# Patient Record
Sex: Male | Born: 1935
Health system: Southern US, Community
[De-identification: ages and names within clinical notes are randomized; demographics above are authoritative.]

## PROBLEM LIST (undated history)

## (undated) DIAGNOSIS — I10 Essential (primary) hypertension: Secondary | ICD-10-CM

## (undated) DIAGNOSIS — I350 Nonrheumatic aortic (valve) stenosis: Secondary | ICD-10-CM

## (undated) DIAGNOSIS — R413 Other amnesia: Secondary | ICD-10-CM

## (undated) DIAGNOSIS — Z86718 Personal history of other venous thrombosis and embolism: Secondary | ICD-10-CM

## (undated) DIAGNOSIS — E785 Hyperlipidemia, unspecified: Secondary | ICD-10-CM

## (undated) HISTORY — DX: Hyperlipidemia, unspecified: E78.5

## (undated) HISTORY — DX: Essential (primary) hypertension: I10

## (undated) HISTORY — DX: Nonrheumatic aortic (valve) stenosis: I35.0

## (undated) HISTORY — DX: Personal history of other venous thrombosis and embolism: Z86.718

## (undated) HISTORY — DX: Other amnesia: R41.3

## (undated) HISTORY — PX: OTHER SURGICAL HISTORY: SHX169

---

## 1998-06-11 ENCOUNTER — Emergency Department (HOSPITAL_COMMUNITY): Admission: EM | Admit: 1998-06-11 | Discharge: 1998-06-12 | Payer: Self-pay | Admitting: Emergency Medicine

## 1998-09-09 ENCOUNTER — Encounter: Admission: RE | Admit: 1998-09-09 | Discharge: 1998-09-09 | Payer: Self-pay | Admitting: Family Medicine

## 2001-12-09 ENCOUNTER — Encounter: Admission: RE | Admit: 2001-12-09 | Discharge: 2001-12-09 | Payer: Self-pay

## 2002-08-14 ENCOUNTER — Encounter: Admission: RE | Admit: 2002-08-14 | Discharge: 2002-08-14 | Payer: Self-pay | Admitting: Family Medicine

## 2002-08-14 ENCOUNTER — Encounter: Payer: Self-pay | Admitting: Family Medicine

## 2005-01-27 ENCOUNTER — Emergency Department (HOSPITAL_COMMUNITY): Admission: EM | Admit: 2005-01-27 | Discharge: 2005-01-27 | Payer: Self-pay | Admitting: Emergency Medicine

## 2008-01-01 DIAGNOSIS — Z86718 Personal history of other venous thrombosis and embolism: Secondary | ICD-10-CM

## 2008-01-01 HISTORY — DX: Personal history of other venous thrombosis and embolism: Z86.718

## 2008-06-10 ENCOUNTER — Inpatient Hospital Stay (HOSPITAL_COMMUNITY): Admission: EM | Admit: 2008-06-10 | Discharge: 2008-06-15 | Payer: Self-pay | Admitting: Emergency Medicine

## 2008-06-10 ENCOUNTER — Encounter: Admission: RE | Admit: 2008-06-10 | Discharge: 2008-06-10 | Payer: Self-pay | Admitting: Family Medicine

## 2008-07-05 HISTORY — PX: TRANSTHORACIC ECHOCARDIOGRAM: SHX275

## 2008-07-14 ENCOUNTER — Ambulatory Visit: Payer: Self-pay | Admitting: Oncology

## 2008-08-06 ENCOUNTER — Ambulatory Visit (HOSPITAL_COMMUNITY): Admission: RE | Admit: 2008-08-06 | Discharge: 2008-08-06 | Payer: Self-pay | Admitting: Oncology

## 2008-09-22 ENCOUNTER — Ambulatory Visit: Payer: Self-pay | Admitting: Oncology

## 2008-09-24 ENCOUNTER — Ambulatory Visit (HOSPITAL_COMMUNITY): Admission: RE | Admit: 2008-09-24 | Discharge: 2008-09-24 | Payer: Self-pay | Admitting: Oncology

## 2008-12-16 ENCOUNTER — Ambulatory Visit: Payer: Self-pay | Admitting: Oncology

## 2008-12-21 ENCOUNTER — Encounter: Admission: RE | Admit: 2008-12-21 | Discharge: 2008-12-21 | Payer: Self-pay | Admitting: Oncology

## 2009-03-23 ENCOUNTER — Ambulatory Visit: Payer: Self-pay | Admitting: Oncology

## 2009-08-02 IMAGING — US US EXTREM LOW VENOUS BILAT
1 series · 14 of 24 positions shown · non-contrast
Comparison: None

CLINICAL DATA: Bilateral lower extremity swelling

BILATERAL LOWER EXTREMITY VENOUS DUPLEX ULTRASOUND
TECHNIQUE: Gray-scale sonography with graded compression, as well
as color Doppler and duplex ultrasound, were performed to evaluate
the deep venous system of both lower extremities from the level of
the common femoral vein through the popliteal and proximal calf
veins. Spectral Doppler was utilized to evaluate flow at rest and
with distal augmentation maneuvers.

[Series 1: us extrem low venous bilat · 14 of 58 slices shown]
[im 1/58]
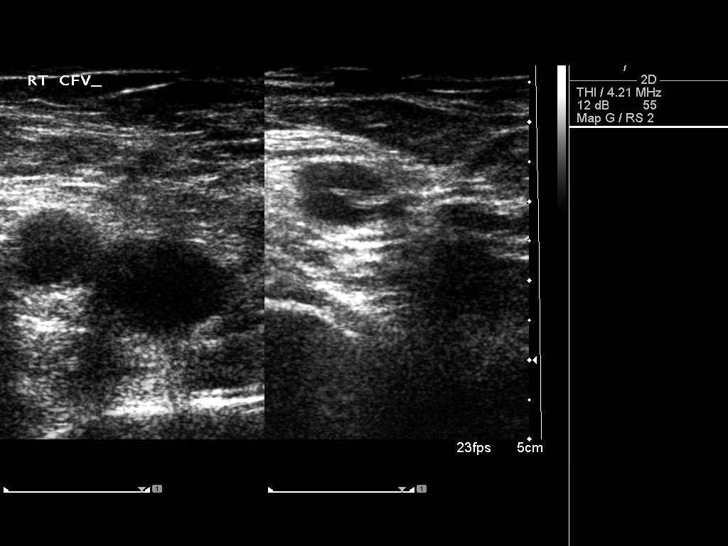
[im 5/58]
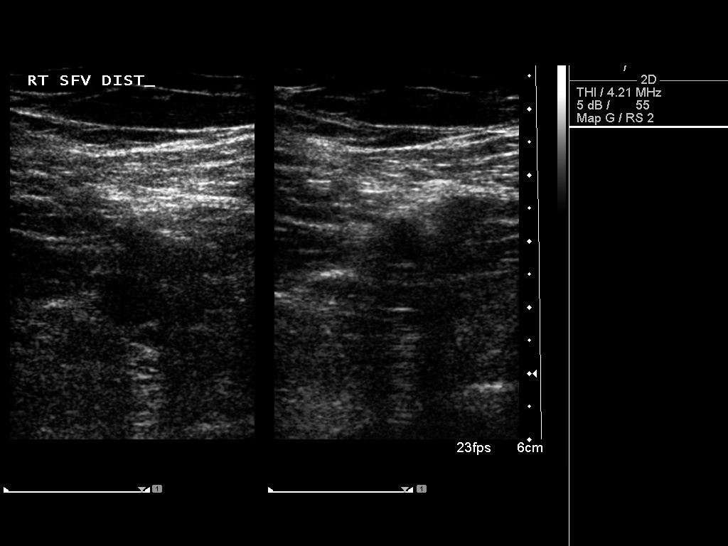
[im 10/58]
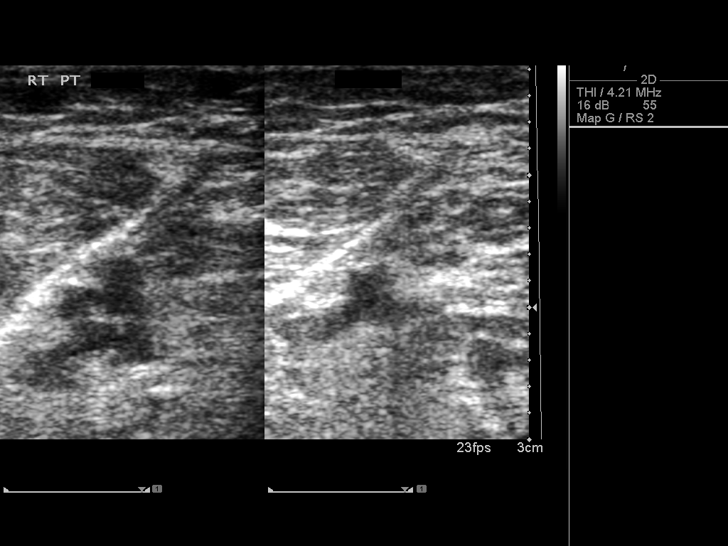
[im 15/58]
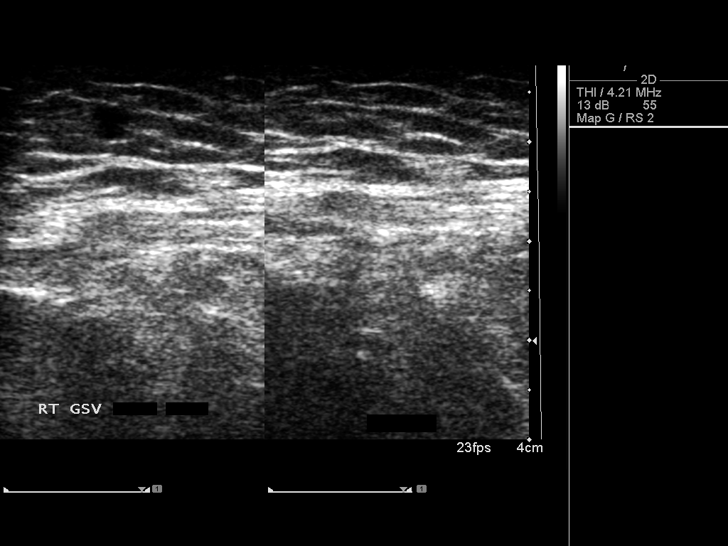
[im 18/58]
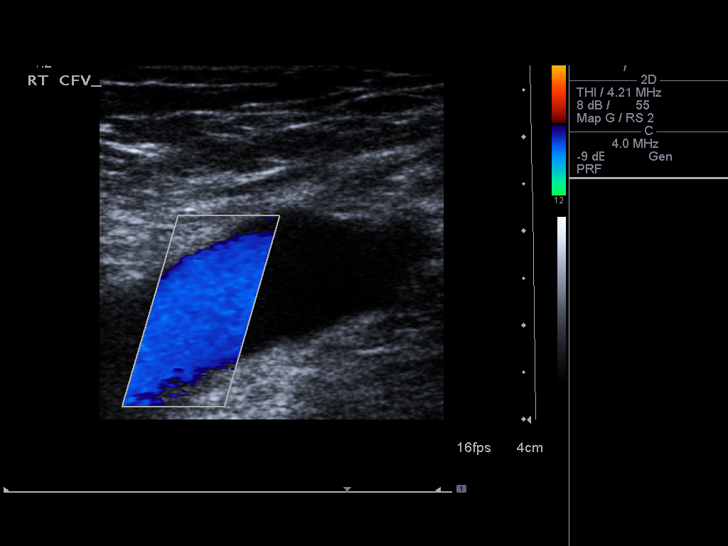
[im 23/58]
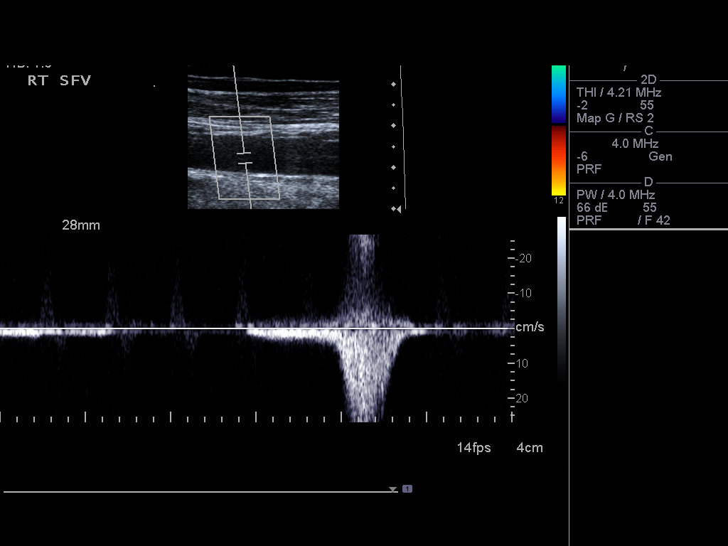
[im 28/58]
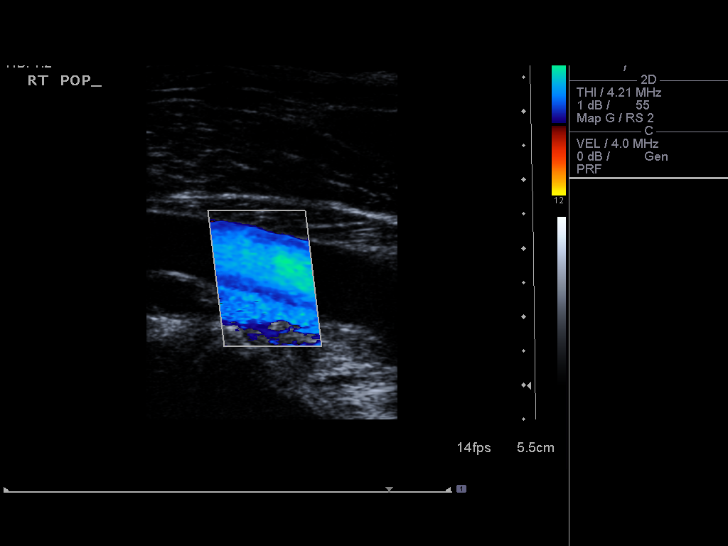
[im 30/58]
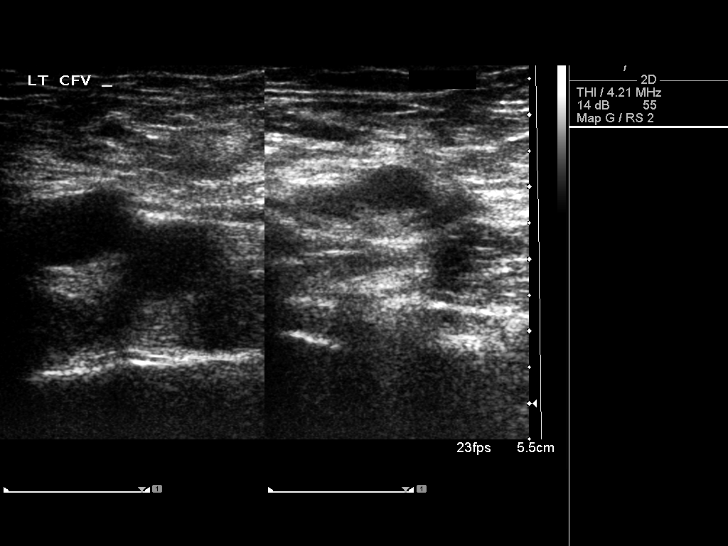
[im 35/58]
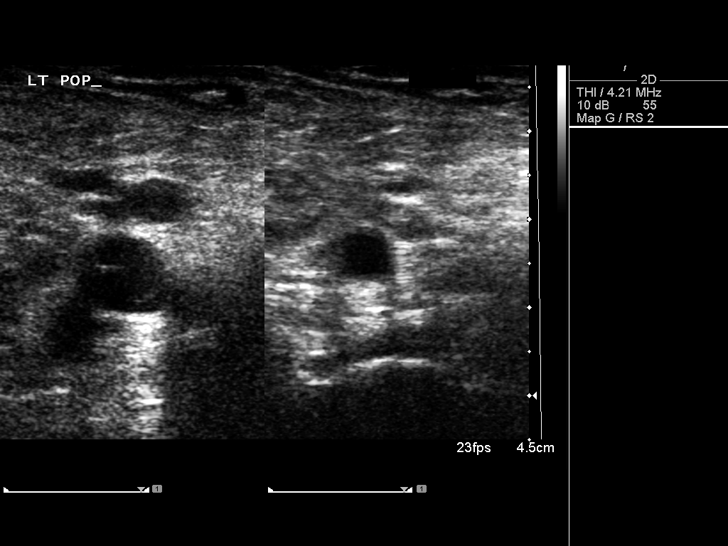
[im 40/58]
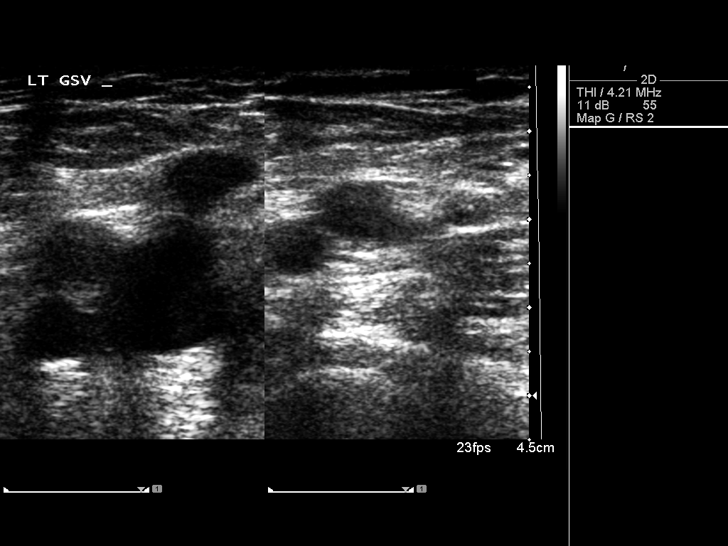
[im 45/58]
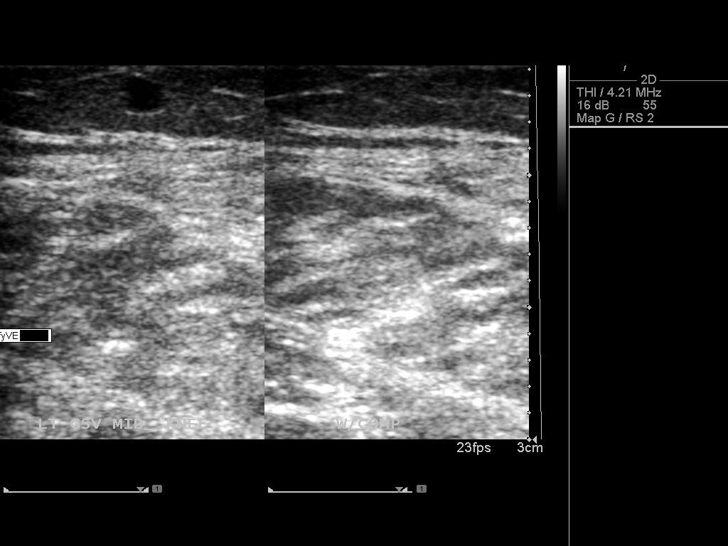
[im 48/58]
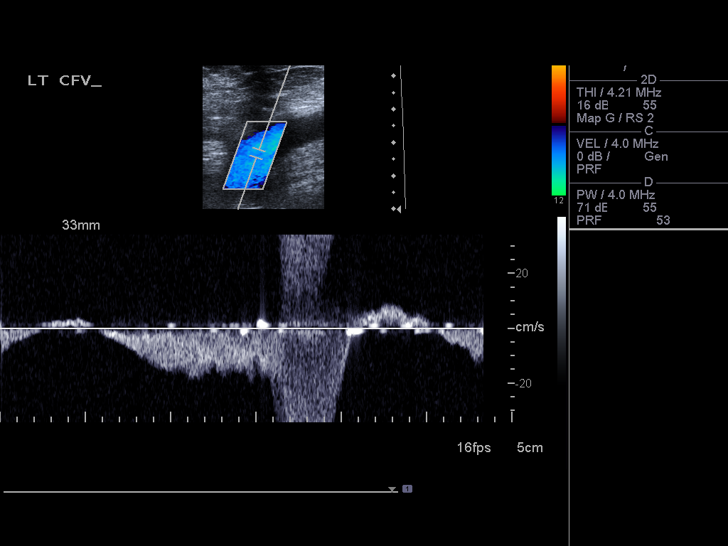
[im 53/58]
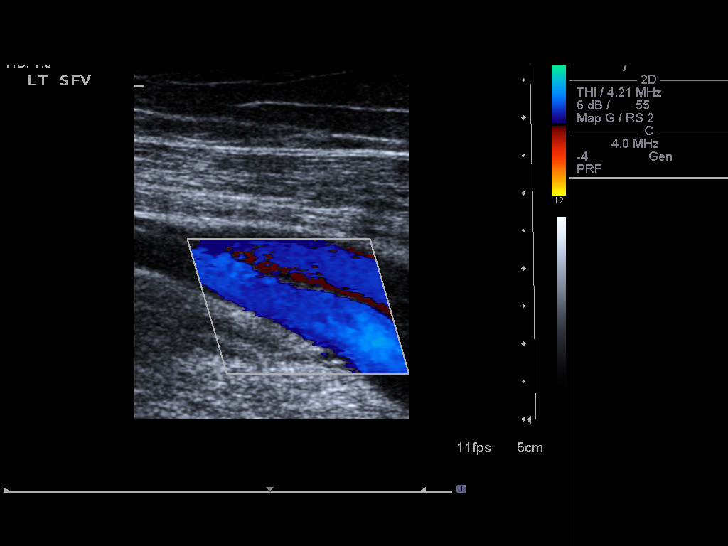
[im 58/58]
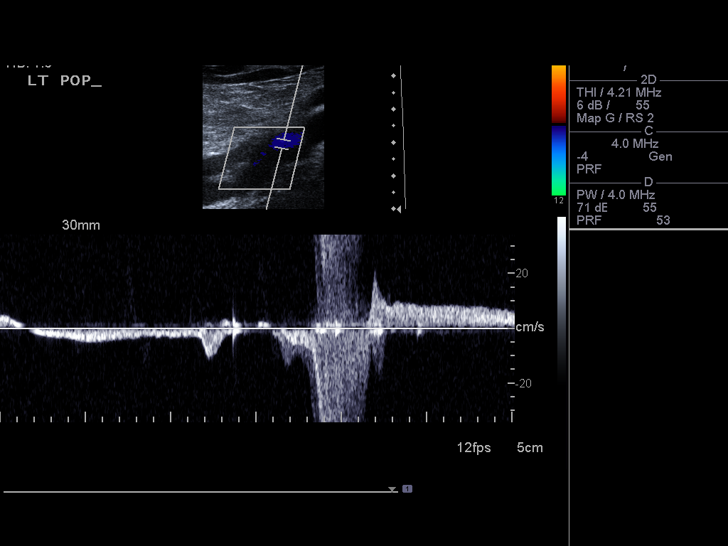

[14 of 24 positions shown; findings below may reference images not displayed]

FINDINGS: Normal compressibility of  bilateral common femoral,
superficial femoral, and popliteal veins is demonstrated, as well
as the visualized proximal calf veins.  No filling defects to
suggest DVT on grayscale or color Doppler imaging.  Doppler
waveforms show normal direction of venous flow, normal respiratory
phasicity and response to augmentation.
IMPRESSION: No evidence of  bilateral lower extremity deep vein thrombosis.

## 2010-01-02 ENCOUNTER — Encounter: Admission: RE | Admit: 2010-01-02 | Discharge: 2010-01-02 | Payer: Self-pay | Admitting: Gastroenterology

## 2010-05-31 HISTORY — PX: OTHER SURGICAL HISTORY: SHX169

## 2011-01-22 ENCOUNTER — Encounter (HOSPITAL_COMMUNITY): Payer: Self-pay | Admitting: Oncology

## 2011-05-15 NOTE — Discharge Summary (Signed)
NAME:  Darryl Page, Darryl Page NO.:  [id number]   MEDICAL RECORD NO.:  [id number]          PATIENT TYPE:  INP   LOCATION:  4734                         FACILITY:  [facility]   PHYSICIAN:  Nicki Guadalajara, M.D.     DATE OF BIRTH:  1935-07-06   DATE OF ADMISSION:  06/10/2008  DATE OF DISCHARGE:  06/15/2008                               DISCHARGE SUMMARY   DISCHARGE DIAGNOSIS:  1. Acute-on-chronic left leg deep vein thrombosis.  2. Treated hypertension.   HOSPITAL COURSE:  The patient is a 76 year old male with a history of  previous remote left DVT, who developed some calf pain and saw his  primary care doctor and was set up for an outpatient Doppler study.  Doppler revealed extensive left DVT, acute-on-chronic.  This was  discussed with Dr. Tresa Endo in the office, and it was felt that it would be  best if the patient was admitted for Lovenox to Coumadin.  The patient  was admitted by Dr. Lynnea Ferrier.  He was ordered for hypercoagulable panel,  but unfortunately this was not drawn.  We had started him on Coumadin  already and initially heparin but later this was changed to Lovenox.  The patient did have some transient bradycardia, but he is a runner, and  he was asymptomatic with this.  Remainder of his hospital course was  essentially unremarkable, we are awaiting for his INR to become  therapeutic, this was followed by the pharmacy.  On June 15, 2008, his  INR is 2.1.  We feel he can be discharged later today after his Lovenox  dose today.  His outpatient Coumadin dose will be 10 mg Monday,  Wednesday, and Friday and 7.5 mg other days.  He will follow up with Dr.  Lynnea Ferrier as an outpatient.  He will need an INR on Thursday.  He does  have a soft systolic murmur at the left sternal border and aortic valve  area and Dr. Clarene Duke has suggested an echocardiogram as an outpatient.  We will get this before he sees Dr. Lynnea Ferrier in the office.   Labs:  White count 5.3, hemoglobin 16.5, hematocrit  40.5, platelets 179,  sodium 139, potassium 3.8, BUN 14, creatinine 0.88, LDL 133, HDL 39,  antithrombin III was 92, CEA is 1.9, PSA is 0.58, D-dimer is 3.06, TSH  2.85.   Chest X-Ray:  Cardiomegaly without acute pulmonary disease.  Venous  Doppler shows DVT with acute-on-chronic thrombosis from the common  femoral vein through the posterior tibial veins at the calf.  INR at  discharge is 2.1.  Telemetry shows sinus rhythm, sinus bradycardia.  12-  lead EKG shows sinus rhythm, sinus bradycardia without acute changes.   DISPOSITION:  The patient will be discharged in stable condition to have  a ProTime on Thursday and an echo before he sees Dr. Lynnea Ferrier back for  systolic murmur and cardiomegaly.     Abelino Derrick, P.A.    ______________________________  Nicki Guadalajara, M.D.   Lenard Lance  D:  06/15/2008  T:  06/16/2008  Job:  [id number]   cc:   Simone Curia

## 2011-09-27 LAB — CARDIOLIPIN ANTIBODIES, IGG, IGM, IGA
Anticardiolipin IgA: 10 — ABNORMAL LOW (ref <13)
Anticardiolipin IgG: <7 — ABNORMAL LOW (ref <11)
Anticardiolipin IgM: <7 — ABNORMAL LOW (ref <10)

## 2011-09-27 LAB — BASIC METABOLIC PANEL
BUN: 13
CO2: 29
CO2: 27
Chloride: 109
GFR calc non Af Amer: >60
Glucose, Bld: 95
Glucose, Bld: 89
Potassium: 3.8
Potassium: 3.8
Sodium: 139

## 2011-09-27 LAB — CEA: CEA: 1.9

## 2011-09-27 LAB — PROTIME-INR
INR: 1
INR: 1.3
INR: 1.6 — ABNORMAL HIGH
Prothrombin Time: 13.2
Prothrombin Time: 14.3

## 2011-09-27 LAB — COMPREHENSIVE METABOLIC PANEL
Albumin: 3.8
BUN: 14
Creatinine, Ser: 0.95
Total Bilirubin: 1.1
Total Protein: 6.6

## 2011-09-27 LAB — CBC
HCT: 46.8
HCT: 47.5
HCT: 48.5
Hemoglobin: 16.3
Hemoglobin: 16.5
Hemoglobin: 17.1 — ABNORMAL HIGH
MCHC: 35
MCHC: 34
MCV: 90.8
MCV: 91.1
MCV: 91.1
Platelets: 171
Platelets: 177
RBC: 5.15
RDW: 12.9
RDW: 12.9
RDW: 13.1
WBC: 5.1
WBC: 4.9

## 2011-09-27 LAB — LUPUS ANTICOAGULANT PANEL
DRVVT: 46.6 (ref 36.1–47.0)
PTT Lupus Anticoagulant: 46.9 (ref 36.3–48.8)

## 2011-09-27 LAB — PROTEIN C, TOTAL: Protein C, Total: 66 % — ABNORMAL LOW (ref 70–140)

## 2011-09-27 LAB — MAGNESIUM: Magnesium: 2.4

## 2011-09-27 LAB — DIFFERENTIAL
Basophils Absolute: 0
Lymphocytes Relative: 16
Monocytes Absolute: 0.6
Monocytes Relative: 9
Neutro Abs: 4.5
Neutrophils Relative %: 72

## 2011-09-27 LAB — PROTEIN S, TOTAL: Protein S Ag, Total: 121 % (ref 70–140)

## 2011-09-27 LAB — ANTITHROMBIN III: AntiThromb III Func: 92 (ref 76–126)

## 2011-09-27 LAB — HOMOCYSTEINE: Homocysteine: 13

## 2011-09-27 LAB — PROTHROMBIN GENE MUTATION

## 2011-09-27 LAB — BETA-2-GLYCOPROTEIN I ABS, IGG/M/A: Beta-2 Glyco I IgG: <4 U/mL (ref <20)

## 2011-09-27 LAB — LIPID PANEL
Cholesterol: 193
LDL Cholesterol: 133 — ABNORMAL HIGH

## 2011-09-27 LAB — PROTEIN C ACTIVITY: Protein C Activity: 43 % — ABNORMAL LOW (ref 75–133)

## 2011-09-28 LAB — BASIC METABOLIC PANEL
BUN: 12
CO2: 29
Chloride: 105
Creatinine, Ser: 1.04
GFR calc Af Amer: >60
Glucose, Bld: 83

## 2011-09-28 LAB — CBC
MCHC: 33.5
MCV: 92.3
RBC: 5.38

## 2011-09-28 LAB — PROTIME-INR: Prothrombin Time: 20.6 — ABNORMAL HIGH

## 2011-10-23 HISTORY — PX: OTHER SURGICAL HISTORY: SHX169

## 2012-01-11 DIAGNOSIS — J029 Acute pharyngitis, unspecified: Secondary | ICD-10-CM | POA: Diagnosis not present

## 2012-01-11 DIAGNOSIS — J069 Acute upper respiratory infection, unspecified: Secondary | ICD-10-CM | POA: Diagnosis not present

## 2012-05-05 DIAGNOSIS — M7500 Adhesive capsulitis of unspecified shoulder: Secondary | ICD-10-CM | POA: Diagnosis not present

## 2012-10-17 DIAGNOSIS — M25569 Pain in unspecified knee: Secondary | ICD-10-CM | POA: Diagnosis not present

## 2012-10-17 DIAGNOSIS — M1710 Unilateral primary osteoarthritis, unspecified knee: Secondary | ICD-10-CM | POA: Diagnosis not present

## 2012-11-07 DIAGNOSIS — Z125 Encounter for screening for malignant neoplasm of prostate: Secondary | ICD-10-CM | POA: Diagnosis not present

## 2012-11-07 DIAGNOSIS — Z79899 Other long term (current) drug therapy: Secondary | ICD-10-CM | POA: Diagnosis not present

## 2012-11-07 DIAGNOSIS — I10 Essential (primary) hypertension: Secondary | ICD-10-CM | POA: Diagnosis not present

## 2012-11-07 DIAGNOSIS — E7800 Pure hypercholesterolemia, unspecified: Secondary | ICD-10-CM | POA: Diagnosis not present

## 2012-11-12 DIAGNOSIS — Z1331 Encounter for screening for depression: Secondary | ICD-10-CM | POA: Diagnosis not present

## 2012-11-12 DIAGNOSIS — I10 Essential (primary) hypertension: Secondary | ICD-10-CM | POA: Diagnosis not present

## 2012-11-12 DIAGNOSIS — E7800 Pure hypercholesterolemia, unspecified: Secondary | ICD-10-CM | POA: Diagnosis not present

## 2012-11-12 DIAGNOSIS — Z0000 Encounter for general adult medical examination without abnormal findings: Secondary | ICD-10-CM | POA: Diagnosis not present

## 2012-11-12 DIAGNOSIS — E875 Hyperkalemia: Secondary | ICD-10-CM | POA: Diagnosis not present

## 2013-06-09 DIAGNOSIS — J40 Bronchitis, not specified as acute or chronic: Secondary | ICD-10-CM | POA: Diagnosis not present

## 2013-07-21 DIAGNOSIS — J069 Acute upper respiratory infection, unspecified: Secondary | ICD-10-CM | POA: Diagnosis not present

## 2013-10-07 DIAGNOSIS — Z23 Encounter for immunization: Secondary | ICD-10-CM | POA: Diagnosis not present

## 2014-01-18 DIAGNOSIS — M1710 Unilateral primary osteoarthritis, unspecified knee: Secondary | ICD-10-CM | POA: Diagnosis not present

## 2014-07-27 DIAGNOSIS — M1710 Unilateral primary osteoarthritis, unspecified knee: Secondary | ICD-10-CM | POA: Diagnosis not present

## 2014-08-24 DIAGNOSIS — IMO0002 Reserved for concepts with insufficient information to code with codable children: Secondary | ICD-10-CM | POA: Diagnosis not present

## 2014-08-28 DIAGNOSIS — M1710 Unilateral primary osteoarthritis, unspecified knee: Secondary | ICD-10-CM | POA: Diagnosis not present

## 2014-09-24 ENCOUNTER — Telehealth: Payer: Self-pay | Admitting: Cardiology

## 2014-09-24 ENCOUNTER — Ambulatory Visit (HOSPITAL_COMMUNITY)
Admission: RE | Admit: 2014-09-24 | Discharge: 2014-09-24 | Disposition: A | Discharge status: Home / Self Care | Payer: Medicare Other | Source: Ambulatory Visit | Attending: Cardiovascular Disease | Admitting: Cardiovascular Disease

## 2014-09-24 DIAGNOSIS — Z86718 Personal history of other venous thrombosis and embolism: Secondary | ICD-10-CM | POA: Insufficient documentation

## 2014-09-24 DIAGNOSIS — M79605 Pain in left leg: Secondary | ICD-10-CM

## 2014-09-24 DIAGNOSIS — M79609 Pain in unspecified limb: Secondary | ICD-10-CM | POA: Insufficient documentation

## 2014-09-24 DIAGNOSIS — M7989 Other specified soft tissue disorders: Secondary | ICD-10-CM

## 2014-09-24 NOTE — Telephone Encounter (Signed)
Pt called in stating that he is a Dr. Ellyn Hack pt (last seen in 2012) and has a history of blood clots in his legs. He said that the blood vessal on his left leg is raised and is concerned. Would like to know is this a matter he should come in and get checked out. Please call  Thanks

## 2014-09-24 NOTE — Telephone Encounter (Signed)
Spoke to Dr Sallyanne Kuster V/o bring patient for  Venous doppler left leg follow up with extender   Patient is only onASA 325 MG NOW ordercompleted  Patient aware ,schedule doppler for 1 pm today.

## 2014-09-24 NOTE — Telephone Encounter (Signed)
NEED PAPER CHART

## 2014-09-24 NOTE — Progress Notes (Addendum)
Left Lower Ext. Venous Duplex Completed. Negative for DVT or SVT in the left lower ext. Adriella Essex, BS, RDMS, RVT  

## 2014-09-24 NOTE — Telephone Encounter (Signed)
Patient doppler was negative. Dr Sallyanne Kuster is aware and patient aware.  Patient made an appointment to see Dr Ellyn Hack later next month.

## 2014-09-24 NOTE — Telephone Encounter (Signed)
SPOKE WITH PATIENT ,  HE HAS NOTICE LAST COUPLE OF DAYS LEFT LEG- SOME PAIN THERE. NO REDNESS  OR WARMTH  NOTED BY PATIENT PATIENT STATES NO RECENT SURGERIES, NOT AS ACTIVE AS BEFORE USE TO BE A RUNNER, BUT STOPPED RIGHT KNEE IS SORE -NEEDS SURGERY. PATIENT DID NOT CONTACT PRIMARY - Dr Drema Dallas.  WILL DISCUSS DR Sallyanne Kuster -(D.O.D)

## 2014-10-14 ENCOUNTER — Encounter: Payer: Self-pay | Admitting: *Deleted

## 2014-10-22 DIAGNOSIS — Z23 Encounter for immunization: Secondary | ICD-10-CM | POA: Diagnosis not present

## 2014-10-29 ENCOUNTER — Ambulatory Visit (INDEPENDENT_AMBULATORY_CARE_PROVIDER_SITE_OTHER): Payer: Medicare Other | Admitting: Cardiology

## 2014-10-29 ENCOUNTER — Encounter: Payer: Self-pay | Admitting: Cardiology

## 2014-10-29 VITALS — BP 150/80 | HR 66 | Ht 71.0 in | Wt 192.3 lb

## 2014-10-29 DIAGNOSIS — E785 Hyperlipidemia, unspecified: Secondary | ICD-10-CM | POA: Diagnosis not present

## 2014-10-29 DIAGNOSIS — Z86718 Personal history of other venous thrombosis and embolism: Secondary | ICD-10-CM

## 2014-10-29 DIAGNOSIS — Z79899 Other long term (current) drug therapy: Secondary | ICD-10-CM | POA: Diagnosis not present

## 2014-10-29 DIAGNOSIS — I10 Essential (primary) hypertension: Secondary | ICD-10-CM

## 2014-10-29 DIAGNOSIS — I872 Venous insufficiency (chronic) (peripheral): Secondary | ICD-10-CM

## 2014-10-29 DIAGNOSIS — I358 Other nonrheumatic aortic valve disorders: Secondary | ICD-10-CM | POA: Diagnosis not present

## 2014-10-29 NOTE — Patient Instructions (Addendum)
Dr. Ellyn Hack would like for you to start wearing support hose to help with leg pain. May be purchased in the drug store or a department store.  He would also like for you to have some lab work.  Your physician has requested that you have an echocardiogram. Echocardiography is a painless test that uses sound waves to create images of your heart. It provides your doctor with information about the size and shape of your heart and how well your heart's chambers and valves are working. This procedure takes approximately one hour. There are no restrictions for this procedure.  Your physician wants you to follow-up in 1 year with Dr. Ellyn Hack. You will receive a reminder letter in the mail 2 months in advance. If you do not receive a letter, please call our office to schedule the follow-up appointment.

## 2014-10-31 ENCOUNTER — Encounter: Payer: Self-pay | Admitting: Cardiology

## 2014-10-31 DIAGNOSIS — Z86718 Personal history of other venous thrombosis and embolism: Secondary | ICD-10-CM | POA: Insufficient documentation

## 2014-10-31 DIAGNOSIS — I872 Venous insufficiency (chronic) (peripheral): Secondary | ICD-10-CM | POA: Insufficient documentation

## 2014-10-31 DIAGNOSIS — I10 Essential (primary) hypertension: Secondary | ICD-10-CM | POA: Insufficient documentation

## 2014-10-31 DIAGNOSIS — E785 Hyperlipidemia, unspecified: Secondary | ICD-10-CM | POA: Insufficient documentation

## 2014-10-31 DIAGNOSIS — I350 Nonrheumatic aortic (valve) stenosis: Secondary | ICD-10-CM | POA: Insufficient documentation

## 2014-10-31 NOTE — Assessment & Plan Note (Signed)
Likely the residual of his prior DVT on the left leg.no findings to suggest any residual thrombus or thrombophlebitis. Be on aspirin alone.  Plan: Lightweight compression stockings.

## 2014-10-31 NOTE — Assessment & Plan Note (Signed)
For the last couple of visits, blood pressure been well controlled.blood pressure today is elevated at 150/80 mm mercury. Continue to monitor, may need to convert from Hyzaar to Diovan-HCT for more potent control.

## 2014-10-31 NOTE — Assessment & Plan Note (Signed)
Has not had labs checked in a long time. We'll go ahead and check chemistries and lipid panel

## 2014-10-31 NOTE — Progress Notes (Signed)
PATIENT: Darryl Page MRN: [id number] DOB: May 14, 1935 PCP: Gerrit Heck, MD  Clinic Note: Chief Complaint  Patient presents with  . Follow-up    recent venous dopplers 9/25; no CO, SOB, Edema; Pt stopped Simvastatin on own    HPI: Darryl Page is a 79 y.o. male with a PMH below who presents today for reevaluation after last being seen in October 2012.. Prior to seeing me, he was a patient of Dr. Elisabeth Cara.  His history of left lower extremity superficial DVT, no longer on that evaluation simply taking aspirin. Recently called the clinic for evaluation of swollen tender pain in the left. He came in for lower extremity venous Dopplers that did not show any evidence of recurrent DVT.  Interval History: He comes in today simply because is due for followup. He had an episode a couple weeks ago where he had a swollen vein in his left leg that was quite uncomfortable. He was evaluated here at the office with lower extremity venous Doppler which did not show evidence of new DVT. The swelling has subsequently abated and not recurred. Otherwise from a cardiac standpoint he seems to be quite stable. He is limited as far as any walking because his knees giving him discomfort.  He is also not getting out and about as much because his and help out with his wife he is now started having memory issues.   The remainder of cardiac review of systems is as follows: no chest pain or dyspnea on exertion positive for - Short spell of swollen tender left leg vein negative for - dyspnea on exertion, irregular heartbeat, loss of consciousness, murmur, orthopnea, palpitations, paroxysmal nocturnal dyspnea, rapid heart rate, shortness of breath or TIA/amaurosis fugax, near syncope; persistent edema   Past Medical History  Diagnosis Date  . History of DVT of lower extremity 2009    LEFT COMMON FEMORAL VEINS AND LEFT POSTERIOR TIBAL VEIS IN THE CALF;  stop taking  warfarin in 2010  . Essential  hypertension   . Hyperlipidemia LDL goal <100     Prior Cardiac Evaluation and Procedural History: Past Surgical History  Procedure Laterality Date  . Doppler echocardiography  07/05/2008    EF =>55%; MILD MITRAL REGURG. Mild aortic valve calcification  . Bilateral lower aterial doppler  10/23/2011    BILATERAL ABIs normal 1.2;  . Left lower venous  doppler  10/23/2011; 09/24/2014    a. L CFV - visble thrombus noted wthin the vessel without complete compressibilty,the complete common femoral vein demonstrate excellent flow throughout consistent with chronic disease; left popliteal vein -visble thrombus;demonstrares excellent flow troughout consistent with chronic disease;; b. 08/2014: L Lower Extr - no acute or chronic thrombosis or thrombophlebitis. Deep venous insufficiency    Allergies  Allergen Reactions  . Ace Inhibitors Cough    Current Outpatient Prescriptions  Medication Sig Dispense Refill  . aspirin EC 325 MG tablet Take 325 mg by mouth daily.    . Glucosamine HCl (GLUCOSAMINE PO) Take 2 tablets by mouth daily.    Marland Kitchen losartan-hydrochlorothiazide (HYZAAR) 100-25 MG per tablet Take 1 tablet by mouth daily.    . Multiple Vitamin (MULTIVITAMIN) tablet Take 1 tablet by mouth daily.    . Omega-3 Fatty Acids (FISH OIL) 1000 MG CAPS Take by mouth. TAKE 2 CAPSULES A DAY    . sildenafil (VIAGRA) 50 MG tablet Take 50 mg by mouth as needed for erectile dysfunction.     No current facility-administered medications for this visit.  History   Social History Narrative   SH: Married - 2 children, 3 GC.  Usually exercises ~3 x week with long walks. Quit smoking in his 28s.  2 glasses of wine/day    family history is not on file.  ROS: A comprehensive Review of Systems - was performed Review of Systems  Constitutional: Negative for fever, chills and malaise/fatigue.  HENT: Negative for nosebleeds.   Respiratory: Negative for cough, shortness of breath and wheezing.     Cardiovascular: Positive for leg swelling. Negative for claudication.       Per HPI  Gastrointestinal: Negative for blood in stool and melena.  Genitourinary: Negative for hematuria.  Neurological: Negative for dizziness, sensory change, speech change, focal weakness, seizures and loss of consciousness.  Endo/Heme/Allergies: Does not bruise/bleed easily.  Psychiatric/Behavioral: Negative for depression. The patient is not nervous/anxious.   All other systems reviewed and are negative.  PHYSICAL EXAM BP 150/80 mmHg  Pulse 66  Ht 5\' 11"  (1.803 m)  Wt 192 lb 4.8 oz (87.227 kg)  BMI 26.83 kg/m2 General appearance: alert, cooperative, appears stated age, no distress and Healthy-appearing. Neck: no adenopathy, no carotid bruit, no JVD, supple, symmetrical, trachea midline and Soft systolic murmur radiating to the right carotid Lungs: clear to auscultation bilaterally, normal percussion bilaterally and Nonlabored, good air movement Heart: normal apical impulse and RRR, normal S1 and split S2, 2/6 harsh early peaking C-D SEM at RUSB radiating to carotids Abdomen: soft, non-tender; bowel sounds normal; no masses,  no organomegaly Extremities: extremities normal, atraumatic, no cyanosis or edema and No evidence of significant varicose veins on the left leg. The saphenous vein is visible, but no longer swollen and tender Pulses: 2+ and symmetric Neurologic: Alert and oriented X 3, normal strength and tone. Normal symmetric reflexes. Normal coordination and gait   Adult ECG Report  Rate: 66 ;  Rhythm: normal sinus rhythm  QRS Axis: -43 - Left Axis Deviation - borderline LAFB ;   Conduction Disturbances: first-degree A-V block  and right bundle branch block  Other Abnormalities: Inferior MI, age undetermined  Narrative Interpretation: stable EKG  Recent Labs: none available  ASSESSMENT / PLAN: Probably stable elderly gentleman here for follow-up. No longer having issues in his left leg. No  evidence of recurrent DVT. Aortic murmur heard on exam - not heard before.  Deep Venous insufficiency of left lower extremity; without ulceration or inflammation Likely the residual of his prior DVT on the left leg.no findings to suggest any residual thrombus or thrombophlebitis. Be on aspirin alone.  Plan: Lightweight compression stockings.  History of DVT of lower extremity Resolved. On aspirin  Aortic systolic murmur on examination Not heard on exam previously. There was evidence of aortic calcification but no comment on sclerosis. Given the history of previous valvular calcification raises concern for possible aortic stenosis.  Plan: 2-D echocardiogram  Hyperlipidemia with target LDL less than 70 Has not had labs checked in a long time. We'll go ahead and check chemistries and lipid panel  Essential hypertension For the last couple of visits, blood pressure been well controlled.blood pressure today is elevated at 150/80 mm mercury. Continue to monitor, may need to convert from Hyzaar to Diovan-HCT for more potent control.    Orders Placed This Encounter  Procedures  . Lipid panel    Order Specific Question:  Has the patient fasted?    Answer:  Yes  . Comprehensive metabolic panel    Order Specific Question:  Has the patient fasted?  Answer:  Yes  . EKG 12-Lead  . 2D Echocardiogram without contrast    Standing Status: Future     Number of Occurrences:      Standing Expiration Date: 10/30/2015    Order Specific Question:  Type of Echo    Answer:  Complete    Order Specific Question:  Where should this test be performed    Answer:  [facility]    Order Specific Question:  Reason for exam-Echo    Answer:  Murmur  785.2 / R01.1   No orders of the defined types were placed in this encounter.    Followup: 1 year   Leonie Man, M.D., M.S. Interventional Cardiologist   Pager # [phone]

## 2014-10-31 NOTE — Assessment & Plan Note (Signed)
Not heard on exam previously. There was evidence of aortic calcification but no comment on sclerosis. Given the history of previous valvular calcification raises concern for possible aortic stenosis.  Plan: 2-D echocardiogram

## 2014-10-31 NOTE — Assessment & Plan Note (Signed)
Resolved. On aspirin

## 2014-11-02 DIAGNOSIS — E785 Hyperlipidemia, unspecified: Secondary | ICD-10-CM | POA: Diagnosis not present

## 2014-11-02 DIAGNOSIS — Z79899 Other long term (current) drug therapy: Secondary | ICD-10-CM | POA: Diagnosis not present

## 2014-11-02 LAB — COMPREHENSIVE METABOLIC PANEL
ALBUMIN: 4.3 g/dL (ref 3.5–5.2)
ALK PHOS: 50 U/L (ref 39–117)
ALT: 152 U/L — ABNORMAL HIGH (ref 0–53)
AST: 83 U/L — ABNORMAL HIGH (ref 0–37)
BUN: 13 mg/dL (ref 6–23)
CO2: 28 meq/L (ref 19–32)
Calcium: 9.3 mg/dL (ref 8.4–10.5)
Chloride: 103 mEq/L (ref 96–112)
Creat: 0.91 mg/dL (ref 0.50–1.35)
GLUCOSE: 87 mg/dL (ref 70–99)
POTASSIUM: 4.3 meq/L (ref 3.5–5.3)
SODIUM: 142 meq/L (ref 135–145)
TOTAL PROTEIN: 6.8 g/dL (ref 6.0–8.3)
Total Bilirubin: 1.2 mg/dL (ref 0.2–1.2)

## 2014-11-02 LAB — LIPID PANEL
CHOL/HDL RATIO: 3.5 ratio
CHOLESTEROL: 202 mg/dL — AB (ref 0–200)
HDL: 58 mg/dL (ref >39)
LDL Cholesterol: 120 mg/dL — ABNORMAL HIGH (ref 0–99)
Triglycerides: 118 mg/dL (ref <150)
VLDL: 24 mg/dL (ref 0–40)

## 2014-11-03 ENCOUNTER — Ambulatory Visit (HOSPITAL_COMMUNITY)
Admission: RE | Admit: 2014-11-03 | Discharge: 2014-11-03 | Disposition: A | Discharge status: Home / Self Care | Payer: Medicare Other | Source: Ambulatory Visit | Attending: Cardiovascular Disease | Admitting: Cardiovascular Disease

## 2014-11-03 ENCOUNTER — Other Ambulatory Visit (HOSPITAL_COMMUNITY): Payer: Self-pay | Admitting: Cardiology

## 2014-11-03 DIAGNOSIS — I10 Essential (primary) hypertension: Secondary | ICD-10-CM | POA: Diagnosis not present

## 2014-11-03 DIAGNOSIS — I359 Nonrheumatic aortic valve disorder, unspecified: Secondary | ICD-10-CM

## 2014-11-03 DIAGNOSIS — I358 Other nonrheumatic aortic valve disorders: Secondary | ICD-10-CM

## 2014-11-03 DIAGNOSIS — R011 Cardiac murmur, unspecified: Secondary | ICD-10-CM

## 2014-11-03 DIAGNOSIS — E785 Hyperlipidemia, unspecified: Secondary | ICD-10-CM | POA: Diagnosis not present

## 2014-11-03 NOTE — Progress Notes (Signed)
2D Echocardiogram Complete.  11/03/2014   Darryl Page, [place]

## 2014-11-08 ENCOUNTER — Telehealth: Payer: Self-pay | Admitting: *Deleted

## 2014-11-08 MED ORDER — ATORVASTATIN CALCIUM 20 MG PO TABS: 20.0000 mg | ORAL_TABLET | Freq: Every day | ORAL | Status: DC

## 2014-11-08 NOTE — Telephone Encounter (Signed)
-----   Message from Leonie Man, MD sent at 11/02/2014 10:05 PM EST ----- Lipids don't look too bad, the total cholesterol is a little bit tired and would like to be in the LDL is above the goal that we would like to be as well. Overall the labs have not changed much in 6 years. At this point I think may be the best option would be to start on low-dose medication to try treat the cholesterol levels in addition to doing dietary modification and exercise.  Plan: Lipitor 20 mg

## 2014-11-08 NOTE — Telephone Encounter (Signed)
Spoke to patient.LABS Result given . Verbalized understanding DIETARY  MODIFICATIONS AND EXERCISE  ATORVASTATIN 20 MG DAILY. #90 DAY SUPPY x 3- [place]

## 2014-11-17 ENCOUNTER — Telehealth: Payer: Self-pay | Admitting: *Deleted

## 2014-11-17 NOTE — Telephone Encounter (Signed)
Spoke to patient. Result given . Verbalized understanding  

## 2014-11-17 NOTE — Telephone Encounter (Signed)
-----   Message from Leonie Man, MD sent at 11/09/2014  6:50 PM EST ----- Pump function looks OK -- moderately thickened LV walls - LVH (from high BP long standing as most likely cause).  This makes the ventricle stiff & poor relaxation.  Murmur - Mild Aortic Stenosis --> will need f/u in a ~2 years to re-assess.  [place]

## 2014-11-17 NOTE — Telephone Encounter (Signed)
Left message on cell and work number Calling regards echo results

## 2015-01-05 DIAGNOSIS — E780 Pure hypercholesterolemia: Secondary | ICD-10-CM | POA: Diagnosis not present

## 2015-01-05 DIAGNOSIS — I350 Nonrheumatic aortic (valve) stenosis: Secondary | ICD-10-CM | POA: Diagnosis not present

## 2015-01-05 DIAGNOSIS — Z0000 Encounter for general adult medical examination without abnormal findings: Secondary | ICD-10-CM | POA: Diagnosis not present

## 2015-01-05 DIAGNOSIS — R7989 Other specified abnormal findings of blood chemistry: Secondary | ICD-10-CM | POA: Diagnosis not present

## 2015-01-05 DIAGNOSIS — Z23 Encounter for immunization: Secondary | ICD-10-CM | POA: Diagnosis not present

## 2015-01-05 DIAGNOSIS — Z1389 Encounter for screening for other disorder: Secondary | ICD-10-CM | POA: Diagnosis not present

## 2015-01-05 DIAGNOSIS — I10 Essential (primary) hypertension: Secondary | ICD-10-CM | POA: Diagnosis not present

## 2015-08-09 DIAGNOSIS — H2513 Age-related nuclear cataract, bilateral: Secondary | ICD-10-CM | POA: Diagnosis not present

## 2015-08-09 DIAGNOSIS — H524 Presbyopia: Secondary | ICD-10-CM | POA: Diagnosis not present

## 2015-08-09 DIAGNOSIS — H02846 Edema of left eye, unspecified eyelid: Secondary | ICD-10-CM | POA: Diagnosis not present

## 2015-08-12 DIAGNOSIS — H02845 Edema of left lower eyelid: Secondary | ICD-10-CM | POA: Diagnosis not present

## 2015-09-27 DIAGNOSIS — H02845 Edema of left lower eyelid: Secondary | ICD-10-CM | POA: Diagnosis not present

## 2015-10-04 ENCOUNTER — Other Ambulatory Visit: Payer: Self-pay | Admitting: Gastroenterology

## 2015-10-04 DIAGNOSIS — R7989 Other specified abnormal findings of blood chemistry: Secondary | ICD-10-CM

## 2015-10-04 DIAGNOSIS — R945 Abnormal results of liver function studies: Principal | ICD-10-CM

## 2015-10-04 DIAGNOSIS — Z86010 Personal history of colonic polyps: Secondary | ICD-10-CM | POA: Diagnosis not present

## 2015-10-04 DIAGNOSIS — R740 Nonspecific elevation of levels of transaminase and lactic acid dehydrogenase [LDH]: Secondary | ICD-10-CM | POA: Diagnosis not present

## 2015-10-11 ENCOUNTER — Ambulatory Visit
Admission: RE | Admit: 2015-10-11 | Discharge: 2015-10-11 | Disposition: A | Discharge status: Home / Self Care | Payer: Medicare Other | Source: Ambulatory Visit | Attending: Gastroenterology | Admitting: Gastroenterology

## 2015-10-11 DIAGNOSIS — R7989 Other specified abnormal findings of blood chemistry: Secondary | ICD-10-CM

## 2015-10-11 DIAGNOSIS — K760 Fatty (change of) liver, not elsewhere classified: Secondary | ICD-10-CM | POA: Diagnosis not present

## 2015-10-11 DIAGNOSIS — R945 Abnormal results of liver function studies: Principal | ICD-10-CM

## 2015-12-13 DIAGNOSIS — D120 Benign neoplasm of cecum: Secondary | ICD-10-CM | POA: Diagnosis not present

## 2015-12-13 DIAGNOSIS — D123 Benign neoplasm of transverse colon: Secondary | ICD-10-CM | POA: Diagnosis not present

## 2015-12-13 DIAGNOSIS — Z09 Encounter for follow-up examination after completed treatment for conditions other than malignant neoplasm: Secondary | ICD-10-CM | POA: Diagnosis not present

## 2015-12-13 DIAGNOSIS — K5730 Diverticulosis of large intestine without perforation or abscess without bleeding: Secondary | ICD-10-CM | POA: Diagnosis not present

## 2015-12-13 DIAGNOSIS — D124 Benign neoplasm of descending colon: Secondary | ICD-10-CM | POA: Diagnosis not present

## 2015-12-13 DIAGNOSIS — D122 Benign neoplasm of ascending colon: Secondary | ICD-10-CM | POA: Diagnosis not present

## 2015-12-13 DIAGNOSIS — Z86010 Personal history of colonic polyps: Secondary | ICD-10-CM | POA: Diagnosis not present

## 2015-12-13 DIAGNOSIS — D126 Benign neoplasm of colon, unspecified: Secondary | ICD-10-CM | POA: Diagnosis not present

## 2016-01-19 DIAGNOSIS — Z23 Encounter for immunization: Secondary | ICD-10-CM | POA: Diagnosis not present

## 2016-01-19 DIAGNOSIS — D179 Benign lipomatous neoplasm, unspecified: Secondary | ICD-10-CM | POA: Diagnosis not present

## 2016-03-08 ENCOUNTER — Other Ambulatory Visit (HOSPITAL_COMMUNITY): Payer: Self-pay | Admitting: Cardiology

## 2016-03-08 NOTE — Telephone Encounter (Signed)
Rx(s) sent to pharmacy electronically. Last OV 09/2014

## 2016-04-07 ENCOUNTER — Other Ambulatory Visit (HOSPITAL_COMMUNITY): Payer: Self-pay | Admitting: Cardiology

## 2016-05-22 IMAGING — US US ABDOMEN COMPLETE
1 series · 14 of 25 positions shown · non-contrast
Comparison: Ultrasound 01/02/2010

CLINICAL DATA: Elevated LFT

EXAM:
ULTRASOUND ABDOMEN COMPLETE

[Series 1: us abdomen complete · 0.30mm/px · 14 of 77 slices shown]
[im 1/77]
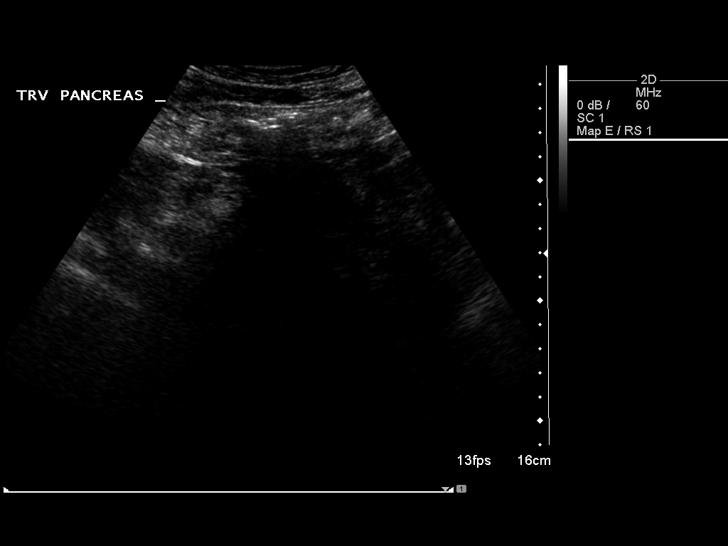
[im 7/77]
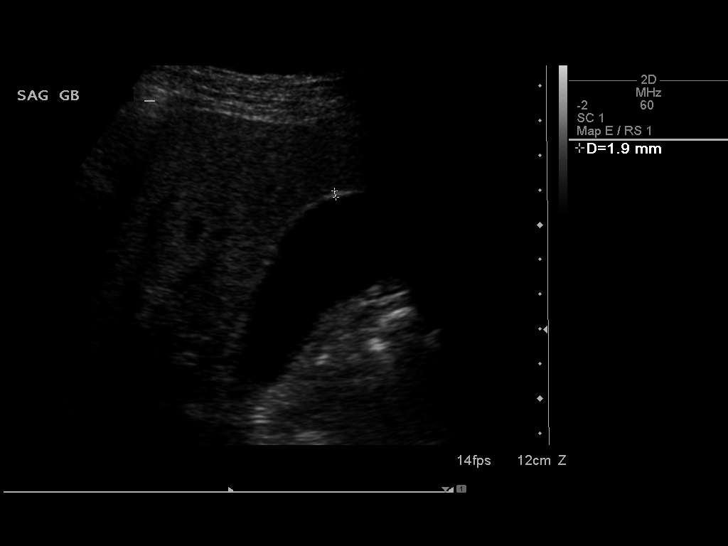
[im 13/77]
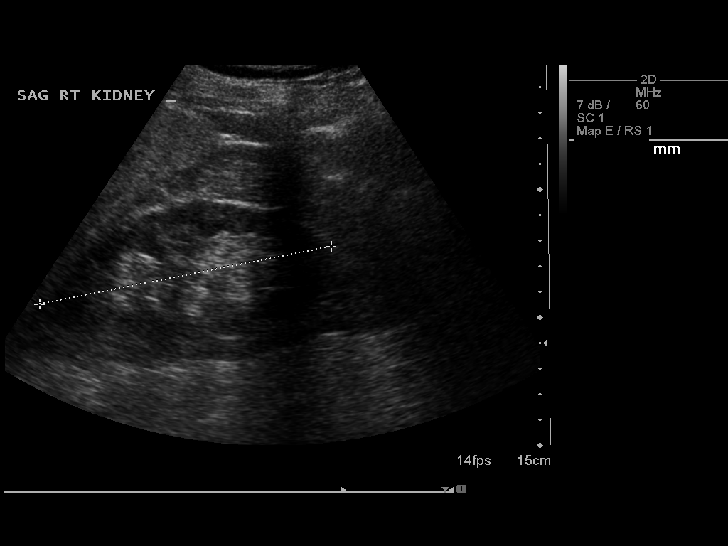
[im 20/77]
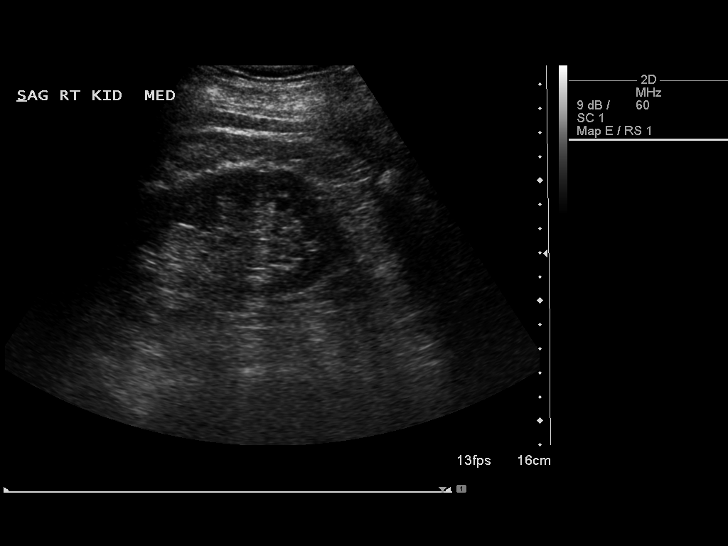
[im 26/77]
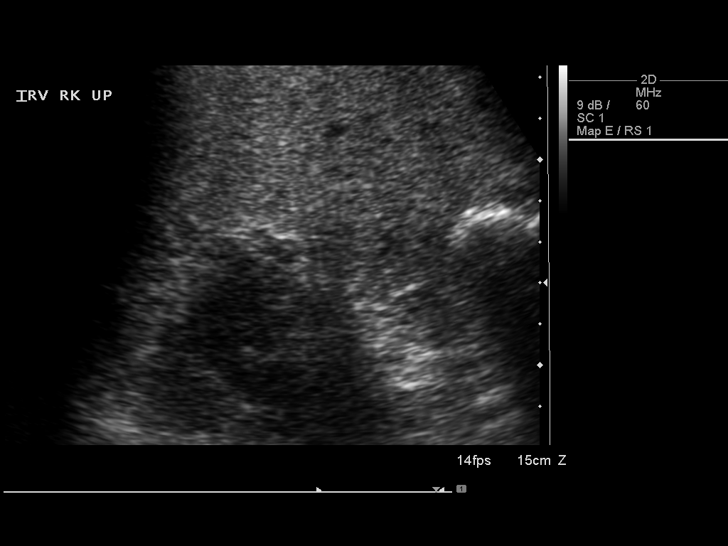
[im 29/77]
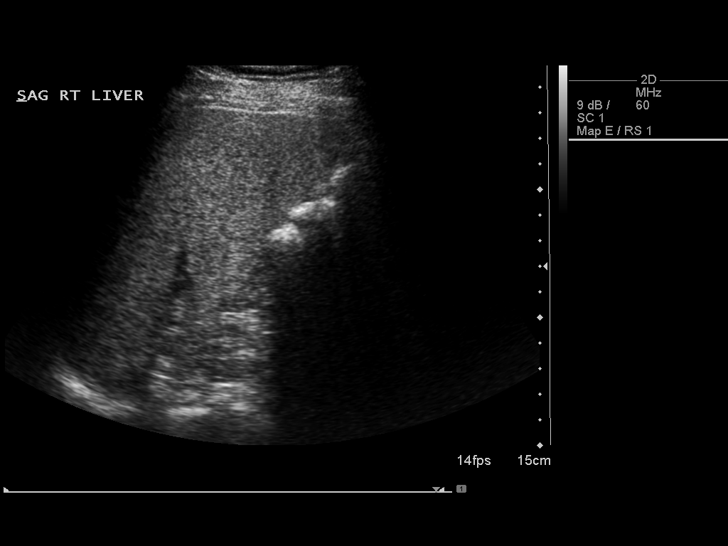
[im 35/77]
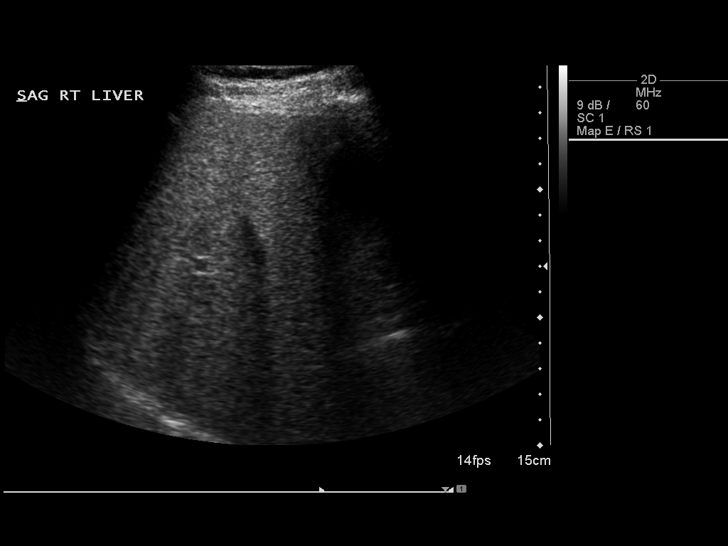
[im 42/77]
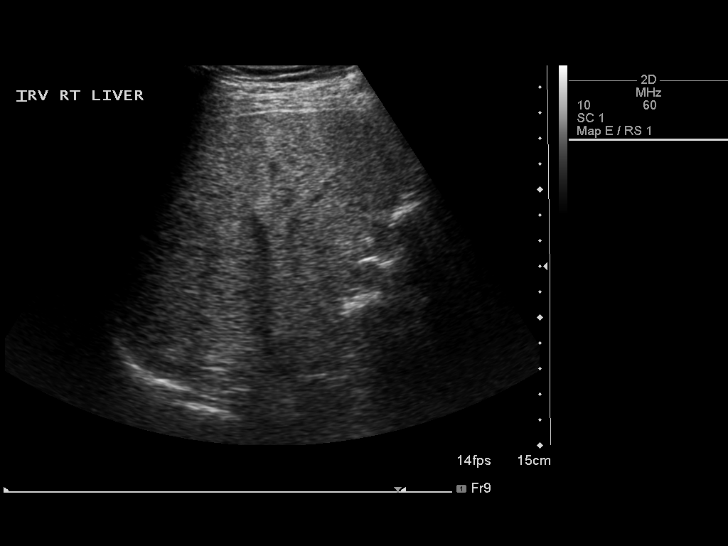
[im 48/77]
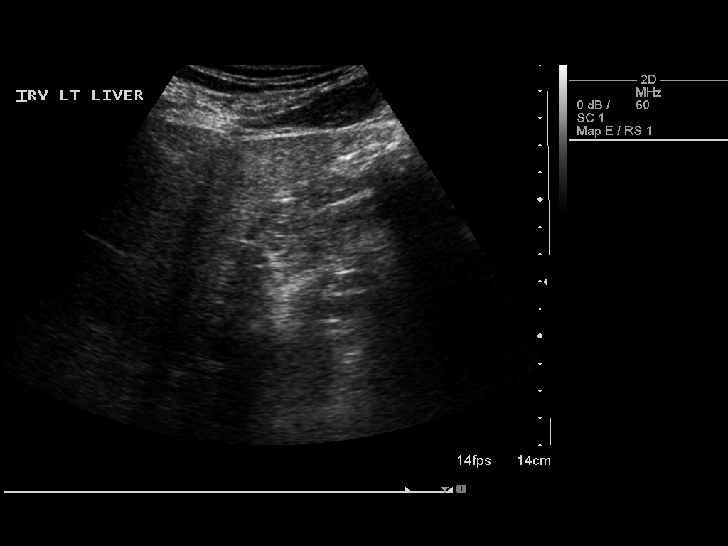
[im 51/77]
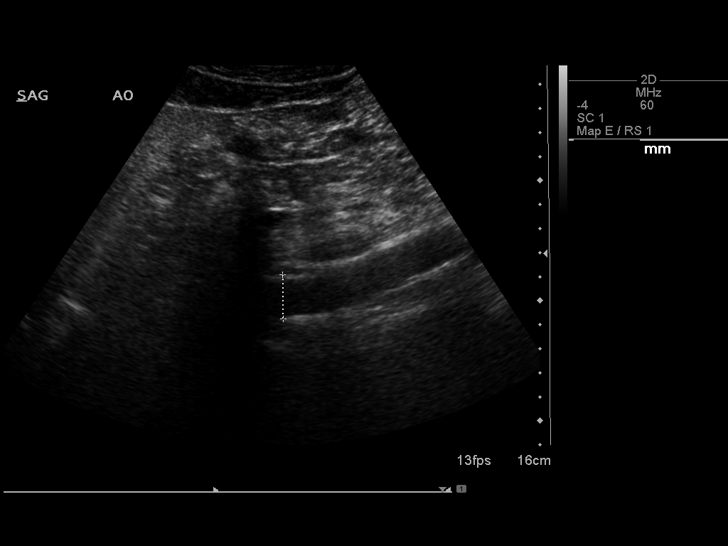
[im 58/77]
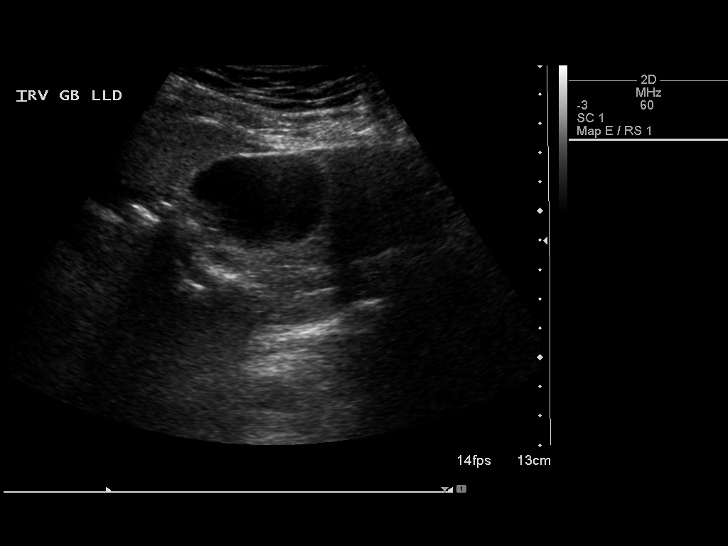
[im 64/77]
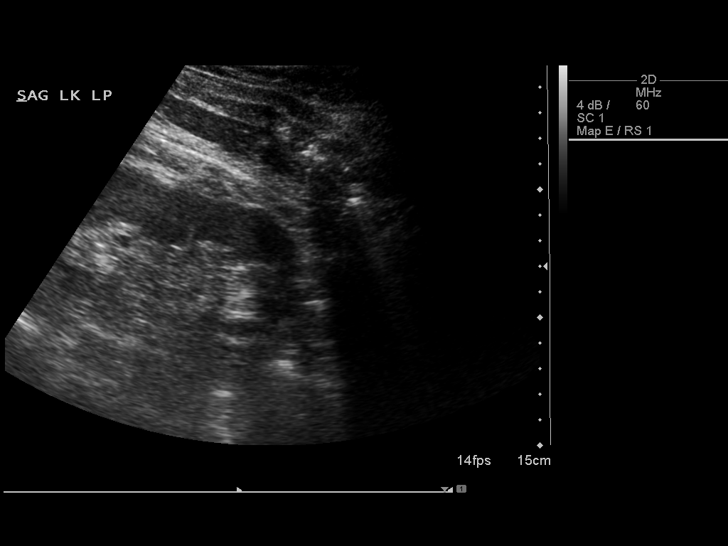
[im 70/77]
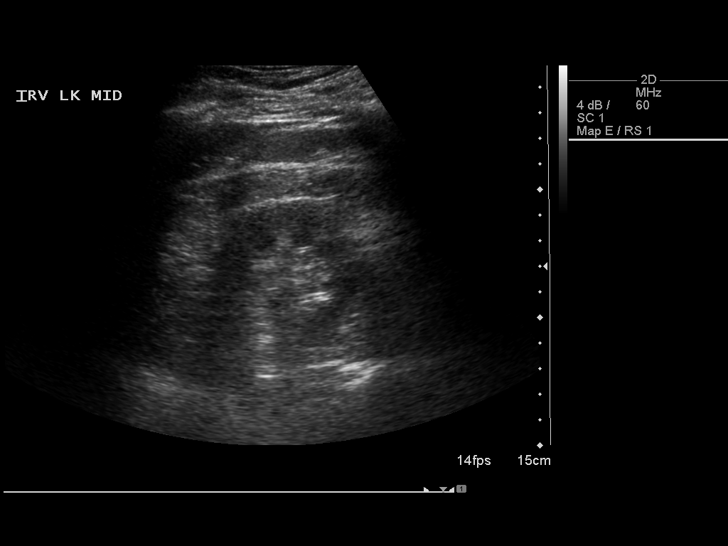
[im 77/77]
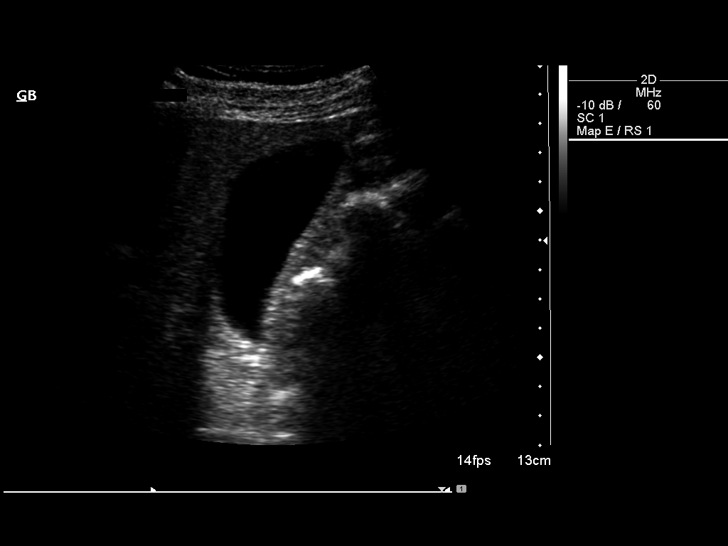

[14 of 25 positions shown; findings below may reference images not displayed]

FINDINGS: Gallbladder: No gallstones or wall thickening visualized. No
sonographic Murphy sign noted.

Common bile duct: Diameter: 2.8 mm

Liver: Increased echogenicity liver diffusely compatible with fatty
infiltration/chronic liver disease. No focal liver mass.

IVC: Obscured by bowel gas

Pancreas: Obscured by bowel gas

Spleen: Size and appearance within normal limits.

Right Kidney: Length: 12.0 cm. Right upper pole cyst measuring
mm and 1.1 mm.. Echogenicity within normal limits. No mass or
hydronephrosis visualized.

Left Kidney: Length: 11.7 cm. Echogenicity within normal limits. No
mass or hydronephrosis visualized.

Abdominal aorta: No aneurysm visualized.

Other findings: None.
IMPRESSION: Negative for gallstones.

Fatty infiltration of the liver without focal liver lesion or
ascites.

## 2016-09-21 DIAGNOSIS — R945 Abnormal results of liver function studies: Secondary | ICD-10-CM | POA: Diagnosis not present

## 2016-09-21 DIAGNOSIS — I350 Nonrheumatic aortic (valve) stenosis: Secondary | ICD-10-CM | POA: Diagnosis not present

## 2016-09-21 DIAGNOSIS — Z23 Encounter for immunization: Secondary | ICD-10-CM | POA: Diagnosis not present

## 2016-09-21 DIAGNOSIS — I10 Essential (primary) hypertension: Secondary | ICD-10-CM | POA: Diagnosis not present

## 2016-09-21 DIAGNOSIS — R7989 Other specified abnormal findings of blood chemistry: Secondary | ICD-10-CM | POA: Diagnosis not present

## 2016-09-21 DIAGNOSIS — E7800 Pure hypercholesterolemia, unspecified: Secondary | ICD-10-CM | POA: Diagnosis not present

## 2016-10-03 ENCOUNTER — Encounter: Payer: Self-pay | Admitting: Cardiology

## 2016-10-05 ENCOUNTER — Telehealth: Payer: Self-pay | Admitting: Cardiology

## 2016-10-05 NOTE — Telephone Encounter (Signed)
Received records from [place] at [facility] for apt with Dr. Ellyn Hack on 10/17/2016 2:15pm. Records given to [facility] (medical records) 10/05/2016 [facility]

## 2016-10-17 ENCOUNTER — Ambulatory Visit (INDEPENDENT_AMBULATORY_CARE_PROVIDER_SITE_OTHER): Payer: Medicare Other | Admitting: Cardiology

## 2016-10-17 ENCOUNTER — Encounter: Payer: Self-pay | Admitting: Cardiology

## 2016-10-17 VITALS — BP 130/70 | HR 67 | Ht 71.0 in | Wt 176.0 lb

## 2016-10-17 DIAGNOSIS — I10 Essential (primary) hypertension: Secondary | ICD-10-CM

## 2016-10-17 DIAGNOSIS — I350 Nonrheumatic aortic (valve) stenosis: Secondary | ICD-10-CM

## 2016-10-17 DIAGNOSIS — E785 Hyperlipidemia, unspecified: Secondary | ICD-10-CM | POA: Diagnosis not present

## 2016-10-17 NOTE — Patient Instructions (Addendum)
SCHEDULE AT [place] has requested that you have an echocardiogram. Echocardiography is a painless test that uses sound waves to create images of your heart. It provides your doctor with information about the size and shape of your heart and how well your heart's chambers and valves are working. This procedure takes approximately one hour. There are no restrictions for this procedure.   NO CHANGE WITH CURRENT MEDICATIONS  Your physician wants you to follow-up in: [place]. You will receive a reminder letter in the mail two months in advance. If you don't receive a letter, please call our office to schedule the follow-up appointment.  If you need a refill on your cardiac medications before your next appointment, please call your pharmacy.

## 2016-10-17 NOTE — Progress Notes (Signed)
PCP: Gerrit Heck, MD  Clinic Note: Chief Complaint  Patient presents with  . Follow-up    Mild aortic stenosis    HPI: Darryl Page is a 81 y.o. male with a PMH below who presents today for 2 year follow-up of mild aortic stenosis.[name] was last seen in October 2015. He had an echocardiogram checked in November 2015  Recent Hospitalizations: N/a  Studies Reviewed:   Transthoracic Echo November 2015 - Left ventricle: The cavity size was normal. Wall thickness was increased in a pattern of moderate LVH. Doppler parameters are consistent with abnormal left ventricular relaxation (grade 1 diastolic dysfunction). - Aortic valve: Moderately calcified annulus. Mildly thickened, mildly calcified leaflets. There was mild stenosis. Valve area (VTI): 3.29 cm^2. Valve area (Vmax): 3.52 cm^2. Valve area (Vmean): 2.84 cm^2. - Right atrium: The atrium was mildly dilated.  Interval History: Darryl Page presents today doing very well. He has no active cardiac complaints. He does have some hearing issues, and needs hearing aids. He is pretty stable cardiac standpoint walks routinely. He is now longer doing any racing, but it did just walk the crop walk which is a 5K walk. He is very active without any symptoms of chest tightness, pressure or dyspnea with rest or exertion. No heart failure symptoms of PND, orthopnea or edema. No arrhythmia symptoms of rapid irregular heartbeat/palpitations. No syncope/near syncope or TIA/amaurosis fugax symptoms.  ROS: A comprehensive was performed. Review of Systems  Constitutional: Negative for malaise/fatigue.  HENT: Positive for hearing loss.   Respiratory: Negative for cough and shortness of breath.   Cardiovascular: Negative for palpitations and claudication.  Gastrointestinal: Negative.   Genitourinary: Negative.   Musculoskeletal: Negative for joint pain and myalgias.  Skin: Negative.   Neurological: Negative for  dizziness.  Endo/Heme/Allergies: Negative.   Psychiatric/Behavioral: Negative for memory loss. The patient is not nervous/anxious and does not have insomnia.     Past Medical History:  Diagnosis Date  . Essential hypertension   . History of DVT of lower extremity 2009   LEFT COMMON FEMORAL VEINS AND LEFT POSTERIOR TIBAL VEIS IN THE CALF;  stop taking  warfarin in 2010  . Hyperlipidemia LDL goal <100   . Mild aortic stenosis 10/31/2014    Past Surgical History:  Procedure Laterality Date  . BILATERAL LOWER ATERIAL DOPPLER  10/23/2011   BILATERAL ABIs normal 1.2;  . DOPPLER ECHOCARDIOGRAPHY  07/05/2008   EF =>55%; MILD MITRAL REGURG. Mild aortic valve calcification  . left lower venous  doppler  10/23/2011; 09/24/2014   a. L CFV - visble thrombus noted wthin the vessel without complete compressibilty,the complete common femoral vein demonstrate excellent flow throughout consistent with chronic disease; left popliteal vein -visble thrombus;demonstrares excellent flow troughout consistent with chronic disease;; b. 08/2014: L Lower Extr - no acute or chronic thrombosis or thrombophlebitis. Deep venous insufficiency   Prior to Admission medications   Medication Sig Start Date Taking? Authorizing Provider  aspirin EC 325 MG tablet Take 325 mg by mouth daily.  Yes Historical Provider, MD  atorvastatin (LIPITOR) 20 MG tablet Take 1 tablet (20 mg total) by mouth daily. <PLEASE MAKE APPOINTMENT FOR REFILLS> 03/08/16 Yes Leonie Man, MD  Glucosamine HCl (GLUCOSAMINE PO) Take 2 tablets by mouth daily.  Yes Historical Provider, MD  losartan-hydrochlorothiazide (HYZAAR) 100-25 MG per tablet Take 1 tablet by mouth daily.  Yes Historical Provider, MD  Multiple Vitamin (MULTIVITAMIN) tablet Take 1 tablet by mouth daily.  Yes Historical Provider, MD  Omega-3 Fatty Acids (FISH OIL) 1000 MG CAPS Take by mouth. TAKE 2 CAPSULES A DAY  Yes Historical Provider, MD  sildenafil (VIAGRA) 50 MG tablet Take 50 mg  by mouth as needed for erectile dysfunction.  Yes Historical Provider, MD   Allergies  Allergen Reactions  . Ace Inhibitors Cough     Social History   Social History  . Marital status: Married    Spouse name: N/A  . Number of children: N/A  . Years of education: N/A   Social History Main Topics  . Smoking status: Former Research scientist (life sciences)  . Smokeless tobacco: Never Used  . Alcohol use None  . Drug use: Unknown  . Sexual activity: Not Asked   Other Topics Concern  . None   Social History Narrative   SH: Married - 2 children, 3 GC.  Usually exercises ~3 x week with long walks. Quit smoking in his 66s.  2 glasses of wine/day   History reviewed. No pertinent family history. - Non-contributory based upon age.  Wt Readings from Last 3 Encounters:  10/17/16 79.8 kg (176 lb)  10/29/14 87.2 kg (192 lb 4.8 oz)    PHYSICAL EXAM BP 130/70   Pulse 67   Ht 5\' 11"  (1.803 m)   Wt 79.8 kg (176 lb)   BMI 24.55 kg/m  General appearance: alert, cooperative, appears stated age, no distress. Well-nourished and well-groomed Neck: no adenopathy, no carotid bruit and no JVD - normal carotid upstroke Lungs: CTA B. normal percussion bilaterally and non-labored Heart: RRR with occasional ectopy. Nondisplaced PMI. Normal S1 with split S2. Harsh 2/6 C-D-D SEM at RUSB radiating to carotids. Abdomen: soft, non-tender; bowel sounds normal; no masses,  no organomegaly; no HJR Extremities: extremities normal, atraumatic, no cyanosis, or edema  Pulses: 2+ and symmetric;  Skin: mobility and turgor normal and no evidence of bleeding or bruising  Neurologic: Mental status: Alert, oriented, thought content appropriate   Adult ECG Report  Rate: 67 ;  Rhythm: normal sinus rhythm and 1 AVB (PR 253), X deviation (-36). RBBB. Cannot exclude inferior MI, age indeterminate.; otherwise normal intervals and durations.  Narrative Interpretation: Stable EKG   Other studies Reviewed: Additional studies/ records that  were reviewed today include:  Recent Labs:  Not available  Lab Results  Component Value Date   CHOL 202 (H) 11/02/2014   HDL 58 11/02/2014   LDLCALC 120 (H) 11/02/2014   TRIG 118 11/02/2014   CHOLHDL 3.5 11/02/2014    ASSESSMENT / PLAN: Problem List Items Addressed This Visit    Mild aortic stenosis - Primary (Chronic)    Soft murmur heard on exam today. Does not sound like it is that much more significant. It has been 2 years since his last check. Due for follow-up echo.  Plan:   Check 2-D echocardiogram.    Continue cardiac risk factor modification.      Relevant Orders   EKG 12-Lead (Completed)   ECHOCARDIOGRAM COMPLETE   Hyperlipidemia with target LDL less than 70 (Chronic)    On atorvastatin. Labs monitored by PCP. Goal LDL with aortic stenosis should be less than 100.      Essential hypertension (Chronic)    Well-controlled on current dose of Hyzaar      Relevant Orders   EKG 12-Lead (Completed)   ECHOCARDIOGRAM COMPLETE    Other Visit Diagnoses   None.     Studies Ordered:   Orders Placed This Encounter  Procedures  . EKG 12-Lead  . ECHOCARDIOGRAM COMPLETE  Current medicines are reviewed at length with the patient today. (+/- concerns) non The following changes have been made: none   Patient Instructions  SCH[facility] AT [facility] has requested that you have an echocardiogram. Echocardiography is a painless test that uses sound waves to create images of your heart. It provides your doctor with information about the size and shape of your heart and how well your heart's chambers and valves are working. This procedure takes approximately one hour. There are no restrictions for this procedure.   NO CHANGE WITH CURRENT MEDICATIONS  Your physician wants you to follow-up in: [place]. You will receive a reminder letter in the mail two months in advance. If you don't receive a letter, please call  our office to schedule the follow-up appointment.  If you need a refill on your cardiac medications before your next appointment, please call your pharmacy.      Glenetta Hew, M.D., M.S. Interventional Cardiologist   Pager # [phone] Phone # [phone] [address] [place], [place] [address]

## 2016-10-18 ENCOUNTER — Encounter: Payer: Self-pay | Admitting: Cardiology

## 2016-10-18 NOTE — Assessment & Plan Note (Addendum)
Soft murmur heard on exam today. Does not sound like it is that much more significant. It has been 2 years since his last check. Due for follow-up echo.  Plan:   Check 2-D echocardiogram.    Continue cardiac risk factor modification.

## 2016-10-18 NOTE — Assessment & Plan Note (Signed)
Well controlled on current dose of Hyzaar. 

## 2016-10-18 NOTE — Assessment & Plan Note (Signed)
On atorvastatin. Labs monitored by PCP. Goal LDL with aortic stenosis should be less than 100.

## 2016-10-31 DIAGNOSIS — I350 Nonrheumatic aortic (valve) stenosis: Secondary | ICD-10-CM

## 2016-10-31 HISTORY — PX: TRANSTHORACIC ECHOCARDIOGRAM: SHX275

## 2016-10-31 HISTORY — DX: Nonrheumatic aortic (valve) stenosis: I35.0

## 2016-11-07 ENCOUNTER — Other Ambulatory Visit: Payer: Self-pay

## 2016-11-07 ENCOUNTER — Ambulatory Visit (HOSPITAL_COMMUNITY): Payer: Medicare Other | Attending: Cardiology

## 2016-11-07 DIAGNOSIS — E785 Hyperlipidemia, unspecified: Secondary | ICD-10-CM | POA: Insufficient documentation

## 2016-11-07 DIAGNOSIS — I119 Hypertensive heart disease without heart failure: Secondary | ICD-10-CM | POA: Diagnosis not present

## 2016-11-07 DIAGNOSIS — I350 Nonrheumatic aortic (valve) stenosis: Secondary | ICD-10-CM | POA: Diagnosis not present

## 2016-11-07 DIAGNOSIS — I10 Essential (primary) hypertension: Secondary | ICD-10-CM

## 2016-11-07 DIAGNOSIS — Z87891 Personal history of nicotine dependence: Secondary | ICD-10-CM | POA: Insufficient documentation

## 2016-11-07 LAB — ECHOCARDIOGRAM COMPLETE
AV Mean grad: 21 mmHg
AV Peak grad: 44 mmHg
AV VEL mean LVOT/AV: 0.45
AV area mean vel ind: 0.57 cm2/m2
AV vel: 1.13
AVAREAMEANV: 1.14 cm2
AVAREAVTI: 1.07 cm2
AVAREAVTIIND: 0.57 cm2/m2
AVLVOTPG: 8 mmHg
AVPKVEL: 331 cm/s
Ao pk vel: 0.42 m/s
Ao-asc: 34 cm
CHL CUP AV PEAK INDEX: 0.54
CHL CUP AV VALUE AREA INDEX: 0.57
CHL CUP DOP CALC LVOT VTI: 28.9 cm
CHL CUP STROKE VOLUME: 39 mL
DOP CAL AO MEAN VELOCITY: 210 cm/s
EERAT: 8.91
EWDT: 285 ms
FS: 38 % (ref 28–44)
IVS/LV PW RATIO, ED: 1.39
LA diam end sys: 41 mm
LA diam index: 2.05 cm/m2
LA vol index: 31.5 mL/m2
LA vol: 63 mL
LASIZE: 41 mm
LAVOLA4C: 54 mL
LV E/e' medial: 8.91
LV E/e'average: 8.91
LV PW d: 12.4 mm — AB (ref 0.6–1.1)
LV SIMPSON'S DISK: 65
LV dias vol: 60 mL — AB (ref 62–150)
LV e' LATERAL: 6.53 cm/s
LV sys vol index: 11 mL/m2
LV sys vol: 21 mL (ref 21–61)
LVDIAVOLIN: 30 mL/m2
LVOT SV: 73 mL
LVOT area: 2.54 cm2
LVOT peak VTI: 0.44 cm
LVOTD: 18 mm
LVOTPV: 139 cm/s
Lateral S' vel: 16.2 cm/s
MV Dec: 285
MV pk E vel: 58.2 m/s
MVPKAVEL: 76.5 m/s
RV sys press: 27 mmHg
Reg peak vel: 244 cm/s
TDI e' lateral: 6.53
TDI e' medial: 5.36
TR max vel: 244 cm/s
VTI: 65.1 cm
Valve area: 1.13 cm2

## 2016-11-09 ENCOUNTER — Other Ambulatory Visit: Payer: Self-pay | Admitting: *Deleted

## 2016-11-09 DIAGNOSIS — I350 Nonrheumatic aortic (valve) stenosis: Secondary | ICD-10-CM | POA: Insufficient documentation

## 2016-11-09 NOTE — Progress Notes (Unsigned)
SEE ECHO RESULTS FROM 10/2016

## 2016-11-28 DIAGNOSIS — R740 Nonspecific elevation of levels of transaminase and lactic acid dehydrogenase [LDH]: Secondary | ICD-10-CM | POA: Diagnosis not present

## 2017-04-01 DIAGNOSIS — L989 Disorder of the skin and subcutaneous tissue, unspecified: Secondary | ICD-10-CM | POA: Diagnosis not present

## 2017-04-01 DIAGNOSIS — Z1389 Encounter for screening for other disorder: Secondary | ICD-10-CM | POA: Diagnosis not present

## 2017-04-01 DIAGNOSIS — R740 Nonspecific elevation of levels of transaminase and lactic acid dehydrogenase [LDH]: Secondary | ICD-10-CM | POA: Diagnosis not present

## 2017-04-01 DIAGNOSIS — Z0000 Encounter for general adult medical examination without abnormal findings: Secondary | ICD-10-CM | POA: Diagnosis not present

## 2017-04-01 DIAGNOSIS — I350 Nonrheumatic aortic (valve) stenosis: Secondary | ICD-10-CM | POA: Diagnosis not present

## 2017-04-01 DIAGNOSIS — E7800 Pure hypercholesterolemia, unspecified: Secondary | ICD-10-CM | POA: Diagnosis not present

## 2017-04-23 DIAGNOSIS — D582 Other hemoglobinopathies: Secondary | ICD-10-CM | POA: Diagnosis not present

## 2017-04-26 DIAGNOSIS — D1801 Hemangioma of skin and subcutaneous tissue: Secondary | ICD-10-CM | POA: Diagnosis not present

## 2017-04-26 DIAGNOSIS — L821 Other seborrheic keratosis: Secondary | ICD-10-CM | POA: Diagnosis not present

## 2017-04-26 DIAGNOSIS — L812 Freckles: Secondary | ICD-10-CM | POA: Diagnosis not present

## 2017-04-26 DIAGNOSIS — C44519 Basal cell carcinoma of skin of other part of trunk: Secondary | ICD-10-CM | POA: Diagnosis not present

## 2017-04-26 DIAGNOSIS — D485 Neoplasm of uncertain behavior of skin: Secondary | ICD-10-CM | POA: Diagnosis not present

## 2017-04-26 DIAGNOSIS — L570 Actinic keratosis: Secondary | ICD-10-CM | POA: Diagnosis not present

## 2017-04-26 DIAGNOSIS — L918 Other hypertrophic disorders of the skin: Secondary | ICD-10-CM | POA: Diagnosis not present

## 2017-06-04 DIAGNOSIS — R51 Headache: Secondary | ICD-10-CM | POA: Diagnosis not present

## 2017-06-04 DIAGNOSIS — H539 Unspecified visual disturbance: Secondary | ICD-10-CM | POA: Diagnosis not present

## 2017-06-04 DIAGNOSIS — R413 Other amnesia: Secondary | ICD-10-CM | POA: Diagnosis not present

## 2017-06-06 ENCOUNTER — Other Ambulatory Visit: Payer: Self-pay | Admitting: Family Medicine

## 2017-06-06 DIAGNOSIS — R519 Headache, unspecified: Secondary | ICD-10-CM

## 2017-06-06 DIAGNOSIS — R413 Other amnesia: Secondary | ICD-10-CM

## 2017-06-06 DIAGNOSIS — R51 Headache: Principal | ICD-10-CM

## 2017-06-07 ENCOUNTER — Ambulatory Visit
Admission: RE | Admit: 2017-06-07 | Discharge: 2017-06-07 | Disposition: A | Discharge status: Home / Self Care | Payer: Medicare Other | Source: Ambulatory Visit | Attending: Family Medicine | Admitting: Family Medicine

## 2017-06-07 DIAGNOSIS — R51 Headache: Secondary | ICD-10-CM | POA: Diagnosis not present

## 2017-06-07 DIAGNOSIS — R413 Other amnesia: Secondary | ICD-10-CM

## 2017-06-07 DIAGNOSIS — R519 Headache, unspecified: Secondary | ICD-10-CM

## 2017-06-10 ENCOUNTER — Ambulatory Visit (HOSPITAL_COMMUNITY)
Admission: RE | Admit: 2017-06-10 | Discharge: 2017-06-10 | Disposition: A | Discharge status: Home / Self Care | Payer: Medicare Other | Source: Ambulatory Visit | Attending: Family Medicine | Admitting: Family Medicine

## 2017-06-10 ENCOUNTER — Other Ambulatory Visit (HOSPITAL_COMMUNITY): Payer: Self-pay | Admitting: Family Medicine

## 2017-06-10 DIAGNOSIS — M79605 Pain in left leg: Secondary | ICD-10-CM | POA: Diagnosis not present

## 2017-06-10 DIAGNOSIS — I82532 Chronic embolism and thrombosis of left popliteal vein: Secondary | ICD-10-CM | POA: Diagnosis not present

## 2017-06-10 DIAGNOSIS — I82812 Embolism and thrombosis of superficial veins of left lower extremities: Secondary | ICD-10-CM | POA: Insufficient documentation

## 2017-06-10 DIAGNOSIS — M7989 Other specified soft tissue disorders: Secondary | ICD-10-CM | POA: Diagnosis not present

## 2017-06-10 DIAGNOSIS — Z86718 Personal history of other venous thrombosis and embolism: Secondary | ICD-10-CM | POA: Diagnosis not present

## 2017-06-10 DIAGNOSIS — R7989 Other specified abnormal findings of blood chemistry: Secondary | ICD-10-CM | POA: Diagnosis not present

## 2017-06-10 DIAGNOSIS — R51 Headache: Secondary | ICD-10-CM | POA: Diagnosis not present

## 2017-06-10 NOTE — Progress Notes (Signed)
*  PRELIMINARY RESULTS* Vascular Ultrasound Left lower extremity venous duplex has been completed.  Preliminary findings: Evidence of partial chronic DVT in the left popliteal vein. Evidence of acute superficial thrombosis in the left lesser saphenous vein.   On hold for 10 min to give results 1:13 PM Spoke with Christy 1:16 PM Patient can leave and Dr. Drema Dallas assistant will contact him with any further instructions.     Landry Mellow, RDMS, RVT  06/10/2017, 1:03 PM

## 2017-06-13 ENCOUNTER — Telehealth: Payer: Self-pay | Admitting: *Deleted

## 2017-06-13 DIAGNOSIS — H524 Presbyopia: Secondary | ICD-10-CM | POA: Diagnosis not present

## 2017-06-13 DIAGNOSIS — H2513 Age-related nuclear cataract, bilateral: Secondary | ICD-10-CM | POA: Diagnosis not present

## 2017-07-30 ENCOUNTER — Ambulatory Visit (INDEPENDENT_AMBULATORY_CARE_PROVIDER_SITE_OTHER): Payer: Medicare Other | Admitting: Neurology

## 2017-07-30 ENCOUNTER — Encounter (INDEPENDENT_AMBULATORY_CARE_PROVIDER_SITE_OTHER): Payer: Self-pay

## 2017-07-30 ENCOUNTER — Encounter: Payer: Self-pay | Admitting: Neurology

## 2017-07-30 DIAGNOSIS — F0390 Unspecified dementia without behavioral disturbance: Secondary | ICD-10-CM | POA: Insufficient documentation

## 2017-07-30 DIAGNOSIS — R413 Other amnesia: Secondary | ICD-10-CM | POA: Diagnosis not present

## 2017-07-30 HISTORY — DX: Other amnesia: R41.3

## 2017-07-30 NOTE — Progress Notes (Signed)
Reason for visit: Memory disturbance  Referring physician: Dr. Luane School is a 82 y.o. male  History of present illness:  Darryl Page is a 82 year old right-handed white male with a history of a memory disturbance that has been present for 4-5 months prior to this evaluation. The patient has mainly noted some problems with remembering names for people, he will sometimes have difficulty with directions while driving. He denies any word finding problems with speaking, and he denies any problems keeping up with medications or appointments. He is living alone, he manages all of his own affairs. He denies any problems with keeping up with finances. He is sleeping well at night, he has a good energy during the day. He denies any numbness or weakness of the extremities, he does have some issues with constipation but bladder control is not an issue. The patient did stumble and bump the right frontal area of the head months ago, he began having headaches following this and a CT scan of the brain was done and was unremarkable. He has had blood work to include a thyroid profile and a B12 level that were unremarkable. He denies significant issues with balance or any falls otherwise. He is improving with his headaches. He is on a statin medication, but he believes that he has been on this medication for several years.  Past Medical History:  Diagnosis Date  . Essential hypertension   . History of DVT of lower extremity 2009   LEFT COMMON FEMORAL VEINS AND LEFT POSTERIOR TIBAL VEIS IN THE CALF;  stop taking  warfarin in 2010  . Hyperlipidemia LDL goal <100   . Mild aortic stenosis 10/31/2014    Past Surgical History:  Procedure Laterality Date  . BILATERAL LOWER ATERIAL DOPPLER  10/23/2011   BILATERAL ABIs normal 1.2;  . DOPPLER ECHOCARDIOGRAPHY  07/05/2008   EF =>55%; MILD MITRAL REGURG. Mild aortic valve calcification  . left lower venous  doppler  10/23/2011; 09/24/2014   a. L CFV -  visble thrombus noted wthin the vessel without complete compressibilty,the complete common femoral vein demonstrate excellent flow throughout consistent with chronic disease; left popliteal vein -visble thrombus;demonstrares excellent flow troughout consistent with chronic disease;; b. 08/2014: L Lower Extr - no acute or chronic thrombosis or thrombophlebitis. Deep venous insufficiency    History reviewed. No pertinent family history.  Social history:  reports that he has quit smoking. He has never used smokeless tobacco. He reports that he drinks alcohol. He reports that he does not use drugs.  Medications:  Prior to Admission medications   Medication Sig Start Date End Date Taking? Authorizing Provider  aspirin EC 325 MG tablet Take 325 mg by mouth daily.   Yes [provider]  atorvastatin (LIPITOR) 20 MG tablet Take 1 tablet (20 mg total) by mouth daily. <PLEASE MAKE APPOINTMENT FOR REFILLS> 03/08/16  Yes Darryl Man, MD  Glucosamine HCl (GLUCOSAMINE PO) Take 2 tablets by mouth daily.   Yes [provider]  losartan-hydrochlorothiazide (HYZAAR) 100-25 MG per tablet Take 1 tablet by mouth daily.   Yes [provider]  Multiple Vitamin (MULTIVITAMIN) tablet Take 1 tablet by mouth daily.   Yes [provider]  Omega-3 Fatty Acids (FISH OIL) 1000 MG CAPS Take by mouth. TAKE 2 CAPSULES A DAY   Yes [provider]      Allergies  Allergen Reactions  . Ace Inhibitors Cough    ROS:  Out of a complete 14 system  review of symptoms, the patient complains only of the following symptoms, and all other reviewed systems are negative.  Weight gain Swelling in the legs Hearing loss Memory loss  Blood pressure (!) 150/79, pulse 68, height 5\' 11"  (1.803 m), weight 203 lb (92.1 kg).  Physical Exam  General: The patient is alert and cooperative at the time of the examination. The patient is moderately obese.  Eyes: Pupils are equal, round, and  reactive to light. Discs are flat bilaterally.  Neck: The neck is supple, no carotid bruits are noted.  Respiratory: The respiratory examination is clear.  Cardiovascular: The cardiovascular examination reveals a regular rate and rhythm, no obvious murmurs or rubs are noted.  Skin: Extremities are without significant edema.  Neurologic Exam  Mental status: The patient is alert and oriented x 3 at the time of the examination. The Mini-Mental Status Examination done today shows a total score 20/30.  Cranial nerves: Facial symmetry is present. There is good sensation of the face to pinprick and soft touch bilaterally. The strength of the facial muscles and the muscles to head turning and shoulder shrug are normal bilaterally. Speech is well enunciated, no aphasia or dysarthria is noted. Extraocular movements are full. Visual fields are full. The tongue is midline, and the patient has symmetric elevation of the soft palate. No obvious hearing deficits are noted.  Motor: The motor testing reveals 5 over 5 strength of all 4 extremities. Good symmetric motor tone is noted throughout.  Sensory: Sensory testing is intact to pinprick, soft touch, vibration sensation, and position sense on all 4 extremities. No evidence of extinction is noted.  Coordination: Cerebellar testing reveals good finger-nose-finger and heel-to-shin bilaterally.  Gait and station: Gait is normal. Tandem gait is unsteady. Romberg is negative. No drift is seen.  Reflexes: Deep tendon reflexes are symmetric, but are depressed bilaterally. Toes are downgoing bilaterally.   CT head 06/07/17:  IMPRESSION: Normal for age. No cause of headache or visual disturbance identified. No post traumatic finding.  * CT scan images were reviewed online. I agree with the written report.    Assessment/Plan:  1. Memory disturbance  The patient has had CT scan evaluation of the brain that was unremarkable, he has had blood work done.  He does not wish to start a medication for memory at this time, he is to contact our office at anytime if the changes his mind. The patient will follow-up otherwise in 6 months for reevaluation.  Jill Alexanders MD 07/30/2017 9:24 AM  [facility] [address] [place], [place] [address]  Phone [phone] Fax [phone]

## 2017-07-30 NOTE — Patient Instructions (Signed)
   Call our office if you desire to go on medications for memory.

## 2017-10-17 ENCOUNTER — Encounter: Payer: Self-pay | Admitting: Cardiology

## 2017-10-17 ENCOUNTER — Ambulatory Visit (INDEPENDENT_AMBULATORY_CARE_PROVIDER_SITE_OTHER): Payer: Medicare Other | Admitting: Cardiology

## 2017-10-17 VITALS — BP 160/72 | HR 73 | Ht 71.0 in | Wt 203.0 lb

## 2017-10-17 DIAGNOSIS — I10 Essential (primary) hypertension: Secondary | ICD-10-CM | POA: Diagnosis not present

## 2017-10-17 DIAGNOSIS — E785 Hyperlipidemia, unspecified: Secondary | ICD-10-CM | POA: Diagnosis not present

## 2017-10-17 DIAGNOSIS — I350 Nonrheumatic aortic (valve) stenosis: Secondary | ICD-10-CM

## 2017-10-17 NOTE — Assessment & Plan Note (Signed)
On Crestor statin. Labs followed by PCP. Would use same recommendations for atherosclerotic disease with LDL goal less than 70. I don't have recent labs.

## 2017-10-17 NOTE — Patient Instructions (Signed)
Medication Instructions: Your physician recommends that you continue on your current medications as directed. Please refer to the Current Medication list given to you today.  Testing/Procedures: Your physician has requested that you have an echocardiogram in November. Echocardiography is a painless test that uses sound waves to create images of your heart. It provides your doctor with information about the size and shape of your heart and how well your heart's chambers and valves are working. This procedure takes approximately one hour. There are no restrictions for this procedure.  Follow-Up: Your physician wants you to follow-up in: 1 year with Dr. Ellyn Hack. You will receive a reminder letter in the mail two months in advance. If you don't receive a letter, please call our office to schedule the follow-up appointment.  If you need a refill on your cardiac medications before your next appointment, please call your pharmacy.

## 2017-10-17 NOTE — Assessment & Plan Note (Signed)
Blood pressure looks well controlled last saw him. Blood pressures a bit high today so aggressively monitored at home and discuss with his PCP. I hesitate to add new medication on one reading. Does seem somewhat anxious today and was rushed to get in here.

## 2017-10-17 NOTE — Assessment & Plan Note (Signed)
Progression of disease from mild to moderate aortic stenosis by last check. We will follow-up in a cardiogram this November to ensure no further progression. The bowel murmur does sound more prominent than would expect for mild stenosis, but does not sound significant enough for a more significant progression.  Plan we discussed signs symptoms concerning for aortic stenosis and he has not had any.
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
## 2017-10-29 ENCOUNTER — Other Ambulatory Visit: Payer: Self-pay

## 2017-10-29 ENCOUNTER — Ambulatory Visit (HOSPITAL_COMMUNITY): Payer: Medicare Other | Attending: Cardiology

## 2017-10-29 DIAGNOSIS — I420 Dilated cardiomyopathy: Secondary | ICD-10-CM | POA: Diagnosis not present

## 2017-10-29 DIAGNOSIS — I350 Nonrheumatic aortic (valve) stenosis: Secondary | ICD-10-CM | POA: Diagnosis not present

## 2017-10-29 DIAGNOSIS — I5030 Unspecified diastolic (congestive) heart failure: Secondary | ICD-10-CM | POA: Insufficient documentation

## 2017-10-29 DIAGNOSIS — I060 Rheumatic aortic stenosis: Secondary | ICD-10-CM | POA: Diagnosis not present

## 2017-12-12 DIAGNOSIS — M7651 Patellar tendinitis, right knee: Secondary | ICD-10-CM | POA: Diagnosis not present

## 2018-02-04 ENCOUNTER — Ambulatory Visit: Payer: Medicare Other | Admitting: Neurology

## 2018-02-04 ENCOUNTER — Telehealth: Payer: Self-pay | Admitting: Neurology

## 2018-02-04 NOTE — Telephone Encounter (Signed)
This patient did not show for a revisit appointment today. 

## 2018-02-05 ENCOUNTER — Encounter: Payer: Self-pay | Admitting: Neurology

## 2018-04-03 DIAGNOSIS — R945 Abnormal results of liver function studies: Secondary | ICD-10-CM | POA: Diagnosis not present

## 2018-04-03 DIAGNOSIS — E7800 Pure hypercholesterolemia, unspecified: Secondary | ICD-10-CM | POA: Diagnosis not present

## 2018-04-03 DIAGNOSIS — M7989 Other specified soft tissue disorders: Secondary | ICD-10-CM | POA: Diagnosis not present

## 2018-04-03 DIAGNOSIS — Z1389 Encounter for screening for other disorder: Secondary | ICD-10-CM | POA: Diagnosis not present

## 2018-04-03 DIAGNOSIS — Z0000 Encounter for general adult medical examination without abnormal findings: Secondary | ICD-10-CM | POA: Diagnosis not present

## 2018-04-16 DIAGNOSIS — M7989 Other specified soft tissue disorders: Secondary | ICD-10-CM | POA: Diagnosis not present

## 2018-06-10 DIAGNOSIS — R945 Abnormal results of liver function studies: Secondary | ICD-10-CM | POA: Diagnosis not present

## 2018-07-26 ENCOUNTER — Emergency Department (HOSPITAL_COMMUNITY): Payer: Medicare Other

## 2018-07-26 ENCOUNTER — Emergency Department (HOSPITAL_COMMUNITY)
Admission: EM | Admit: 2018-07-26 | Discharge: 2018-07-27 | Disposition: A | Discharge status: Home / Self Care | Payer: Medicare Other | Attending: Emergency Medicine | Admitting: Emergency Medicine

## 2018-07-26 ENCOUNTER — Encounter (HOSPITAL_COMMUNITY): Payer: Self-pay | Admitting: Emergency Medicine

## 2018-07-26 ENCOUNTER — Other Ambulatory Visit: Payer: Self-pay

## 2018-07-26 DIAGNOSIS — S199XXA Unspecified injury of neck, initial encounter: Secondary | ICD-10-CM | POA: Diagnosis not present

## 2018-07-26 DIAGNOSIS — S299XXA Unspecified injury of thorax, initial encounter: Secondary | ICD-10-CM | POA: Diagnosis not present

## 2018-07-26 DIAGNOSIS — R4182 Altered mental status, unspecified: Secondary | ICD-10-CM | POA: Diagnosis not present

## 2018-07-26 DIAGNOSIS — Y9289 Other specified places as the place of occurrence of the external cause: Secondary | ICD-10-CM | POA: Insufficient documentation

## 2018-07-26 DIAGNOSIS — W19XXXA Unspecified fall, initial encounter: Secondary | ICD-10-CM | POA: Diagnosis not present

## 2018-07-26 DIAGNOSIS — F10920 Alcohol use, unspecified with intoxication, uncomplicated: Secondary | ICD-10-CM | POA: Insufficient documentation

## 2018-07-26 DIAGNOSIS — Y998 Other external cause status: Secondary | ICD-10-CM | POA: Diagnosis not present

## 2018-07-26 DIAGNOSIS — I10 Essential (primary) hypertension: Secondary | ICD-10-CM | POA: Insufficient documentation

## 2018-07-26 DIAGNOSIS — Z79899 Other long term (current) drug therapy: Secondary | ICD-10-CM | POA: Insufficient documentation

## 2018-07-26 DIAGNOSIS — R55 Syncope and collapse: Secondary | ICD-10-CM | POA: Diagnosis present

## 2018-07-26 DIAGNOSIS — S0990XA Unspecified injury of head, initial encounter: Secondary | ICD-10-CM | POA: Diagnosis not present

## 2018-07-26 DIAGNOSIS — Y9389 Activity, other specified: Secondary | ICD-10-CM | POA: Diagnosis not present

## 2018-07-26 DIAGNOSIS — Z7982 Long term (current) use of aspirin: Secondary | ICD-10-CM | POA: Diagnosis not present

## 2018-07-26 DIAGNOSIS — S069X9A Unspecified intracranial injury with loss of consciousness of unspecified duration, initial encounter: Secondary | ICD-10-CM | POA: Diagnosis not present

## 2018-07-26 DIAGNOSIS — I4510 Unspecified right bundle-branch block: Secondary | ICD-10-CM | POA: Diagnosis not present

## 2018-07-26 DIAGNOSIS — M542 Cervicalgia: Secondary | ICD-10-CM | POA: Diagnosis not present

## 2018-07-26 DIAGNOSIS — E1165 Type 2 diabetes mellitus with hyperglycemia: Secondary | ICD-10-CM | POA: Diagnosis not present

## 2018-07-26 DIAGNOSIS — I4891 Unspecified atrial fibrillation: Secondary | ICD-10-CM | POA: Diagnosis not present

## 2018-07-26 DIAGNOSIS — R4020 Unspecified coma: Secondary | ICD-10-CM | POA: Diagnosis not present

## 2018-07-26 LAB — CBC WITH DIFFERENTIAL/PLATELET
Abs Immature Granulocytes: 0 10*3/uL (ref 0.0–0.1)
Basophils Absolute: 0 10*3/uL (ref 0.0–0.1)
Basophils Relative: 0 %
Eosinophils Absolute: 0.2 10*3/uL (ref 0.0–0.7)
Eosinophils Relative: 2 %
HCT: 46.5 % (ref 39.0–52.0)
Hemoglobin: 16 g/dL (ref 13.0–17.0)
IMMATURE GRANULOCYTES: 0 %
LYMPHS ABS: 1.3 10*3/uL (ref 0.7–4.0)
Lymphocytes Relative: 14 %
MCH: 33.5 pg (ref 26.0–34.0)
MCHC: 34.4 g/dL (ref 30.0–36.0)
MCV: 97.3 fL (ref 78.0–100.0)
Monocytes Absolute: 0.8 10*3/uL (ref 0.1–1.0)
Monocytes Relative: 8 %
NEUTROS ABS: 7.2 10*3/uL (ref 1.7–7.7)
NEUTROS PCT: 76 %
Platelets: 155 10*3/uL (ref 150–400)
RBC: 4.78 MIL/uL (ref 4.22–5.81)
RDW: 12.4 % (ref 11.5–15.5)
WBC: 9.4 10*3/uL (ref 4.0–10.5)

## 2018-07-26 LAB — BASIC METABOLIC PANEL
Anion gap: 12 (ref 5–15)
BUN: 14 mg/dL (ref 8–23)
CALCIUM: 8.8 mg/dL — AB (ref 8.9–10.3)
CHLORIDE: 99 mmol/L (ref 98–111)
CO2: 26 mmol/L (ref 22–32)
Creatinine, Ser: 0.91 mg/dL (ref 0.61–1.24)
GFR calc non Af Amer: >60 mL/min (ref >60)
Glucose, Bld: 121 mg/dL — ABNORMAL HIGH (ref 70–99)
Potassium: 3 mmol/L — ABNORMAL LOW (ref 3.5–5.1)
Sodium: 137 mmol/L (ref 135–145)

## 2018-07-26 LAB — PROTIME-INR
INR: 1.07
Prothrombin Time: 13.8 seconds (ref 11.4–15.2)

## 2018-07-26 LAB — ETHANOL: ALCOHOL ETHYL (B): 235 mg/dL — AB (ref <10)

## 2018-07-26 LAB — I-STAT TROPONIN, ED: Troponin i, poc: 0 ng/mL (ref 0.00–0.08)

## 2018-07-26 LAB — CBG MONITORING, ED: GLUCOSE-CAPILLARY: 123 mg/dL — AB (ref 70–99)
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
## 2018-07-26 NOTE — ED Notes (Signed)
Nurse collected labs. 

## 2018-07-26 NOTE — ED Notes (Signed)
Portable xray at bedside.
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
## 2018-07-26 NOTE — ED Notes (Signed)
ED Provider at bedside. 

## 2018-07-26 NOTE — ED Notes (Signed)
Patient transported to CT 

## 2018-07-26 NOTE — ED Notes (Signed)
C collar removed, ok by EDP

## 2018-07-27 DIAGNOSIS — F10920 Alcohol use, unspecified with intoxication, uncomplicated: Secondary | ICD-10-CM | POA: Diagnosis not present

## 2018-07-27 MED ORDER — SODIUM CHLORIDE 0.9 % IV BOLUS
1000.0000 mL | Freq: Once | INTRAVENOUS | Status: AC
  Administered 2018-07-27: 1000 mL via INTRAVENOUS

## 2018-07-27 NOTE — ED Notes (Addendum)
Pt discharged from ED; instructions provided; Pt encouraged to return to ED if symptoms worsen and to f/u with PCP; Pt verbalized understanding of all instructions 

## 2018-07-27 NOTE — ED Notes (Signed)
Pt ambulated with stand by assist; tolerated well

## 2018-07-27 NOTE — ED Provider Notes (Signed)
I assumed care of this patient from Dr. Maryan Rued at midnight.  Please see their note for further details of Hx, PE.  Briefly patient is a 83 y.o. male who presented with fall while intoxicated.  Imaging negative.  Plan is to allow patient to metabolize to freedom and reassess in the morning.  Allowed to metabolize.  Ambulated without complication.  Clinically sober.  The patient is safe for discharge with strict return precautions.   Disposition: Discharge  Condition: Good  I have discussed the results, Dx and Tx plan with the patient who expressed understanding and agree(s) with the plan. Discharge instructions discussed at great length. The patient was given strict return precautions who verbalized understanding of the instructions. No further questions at time of discharge.    ED Discharge Orders    None       Follow Up: Leighton Ruff, [place] [address] [phone]  Schedule an appointment as soon as possible for a visit  As needed      Cardama, Grayce Sessions, MD 07/27/18 [phone]

## 2018-09-02 DIAGNOSIS — R945 Abnormal results of liver function studies: Secondary | ICD-10-CM | POA: Diagnosis not present

## 2018-09-23 DIAGNOSIS — E7800 Pure hypercholesterolemia, unspecified: Secondary | ICD-10-CM | POA: Diagnosis not present

## 2018-09-23 DIAGNOSIS — M79605 Pain in left leg: Secondary | ICD-10-CM | POA: Diagnosis not present

## 2018-09-30 DIAGNOSIS — M25552 Pain in left hip: Secondary | ICD-10-CM | POA: Diagnosis not present

## 2018-09-30 DIAGNOSIS — M545 Low back pain: Secondary | ICD-10-CM | POA: Diagnosis not present

## 2018-10-16 DIAGNOSIS — M5416 Radiculopathy, lumbar region: Secondary | ICD-10-CM | POA: Diagnosis not present

## 2018-10-16 DIAGNOSIS — M7062 Trochanteric bursitis, left hip: Secondary | ICD-10-CM | POA: Diagnosis not present

## 2018-10-20 DIAGNOSIS — M5416 Radiculopathy, lumbar region: Secondary | ICD-10-CM | POA: Diagnosis not present

## 2018-10-20 DIAGNOSIS — M7062 Trochanteric bursitis, left hip: Secondary | ICD-10-CM | POA: Diagnosis not present

## 2018-10-24 DIAGNOSIS — M25552 Pain in left hip: Secondary | ICD-10-CM | POA: Diagnosis not present

## 2018-10-24 DIAGNOSIS — M545 Low back pain: Secondary | ICD-10-CM | POA: Diagnosis not present

## 2018-11-19 DIAGNOSIS — M545 Low back pain: Secondary | ICD-10-CM | POA: Diagnosis not present

## 2018-11-19 DIAGNOSIS — M5416 Radiculopathy, lumbar region: Secondary | ICD-10-CM | POA: Diagnosis not present

## 2018-11-19 DIAGNOSIS — M25552 Pain in left hip: Secondary | ICD-10-CM | POA: Diagnosis not present

## 2019-01-21 DIAGNOSIS — M545 Low back pain: Secondary | ICD-10-CM | POA: Diagnosis not present

## 2019-01-21 DIAGNOSIS — M25552 Pain in left hip: Secondary | ICD-10-CM | POA: Diagnosis not present

## 2019-02-05 DIAGNOSIS — M545 Low back pain: Secondary | ICD-10-CM | POA: Diagnosis not present

## 2019-02-05 DIAGNOSIS — M5416 Radiculopathy, lumbar region: Secondary | ICD-10-CM | POA: Diagnosis not present

## 2019-02-24 DIAGNOSIS — M5416 Radiculopathy, lumbar region: Secondary | ICD-10-CM | POA: Diagnosis not present

## 2019-02-24 DIAGNOSIS — M545 Low back pain: Secondary | ICD-10-CM | POA: Diagnosis not present

## 2019-03-07 IMAGING — CT CT CERVICAL SPINE W/O CM
5 of 8 series · 11 of 33 positions shown, 12 images · non-contrast
Comparison: Head CT 06/07/2017

CLINICAL DATA: Fall from stool while drinking at a bar striking
head. Altered mental status. Altered level of consciousness (LOC),
unexplained; Neck pain, initial exam

EXAM:
CT HEAD WITHOUT CONTRAST
CT CERVICAL SPINE WITHOUT CONTRAST
TECHNIQUE: Multidetector CT imaging of the head and cervical spine was
performed following the standard protocol without intravenous
contrast. Multiplanar CT image reconstructions of the cervical spine
were also generated.

[Series 4: head bone · axial · 0.44mm/px · z∈[-36,+20]mm · 2 of 84 slices shown]
[im 28/84  bone]
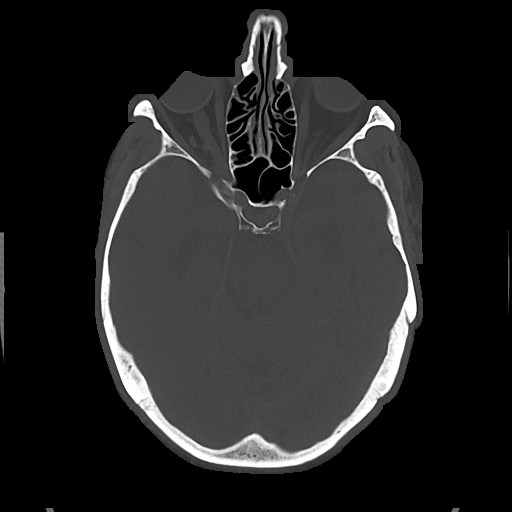
[im 56/84  bone]
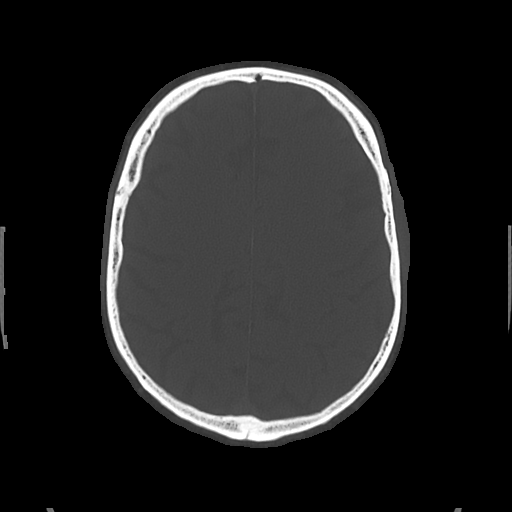

[Series 8: c spine soft · axial · 0.32mm/px · z∈[-188,-124]mm · 2 of 98 slices shown]
[im 33/98  soft-tissue]
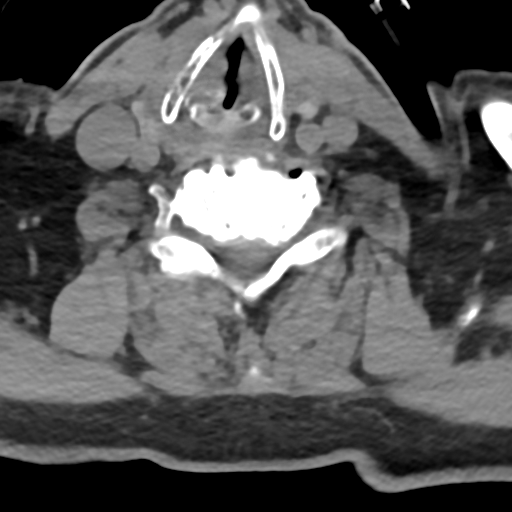
[im 65/98  soft-tissue]
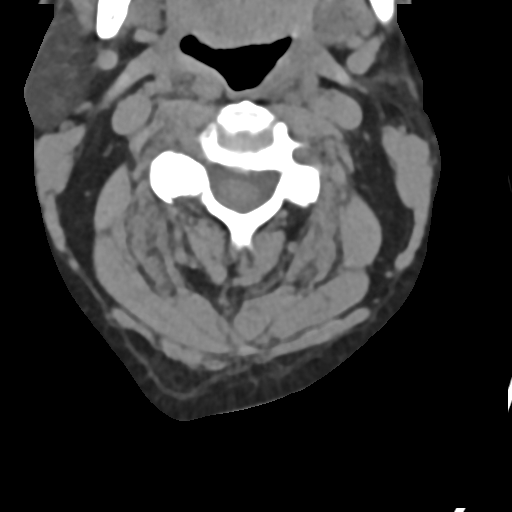

[Series 9: sag bone · sagittal · 0.32mm/px · 4 of 51 slices shown]
[im 11/51  bone]
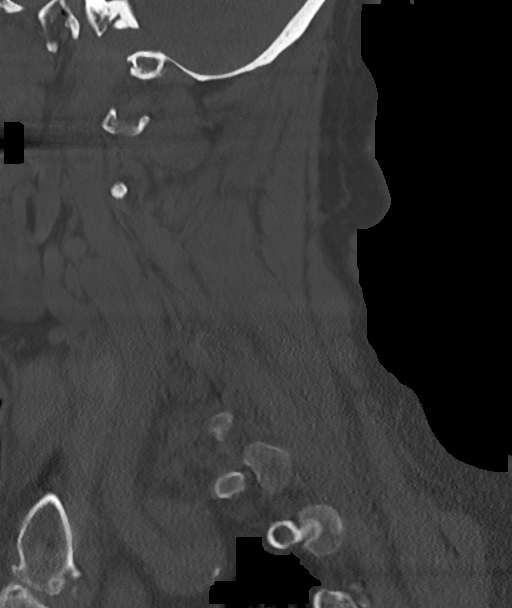
[im 21/51  bone]
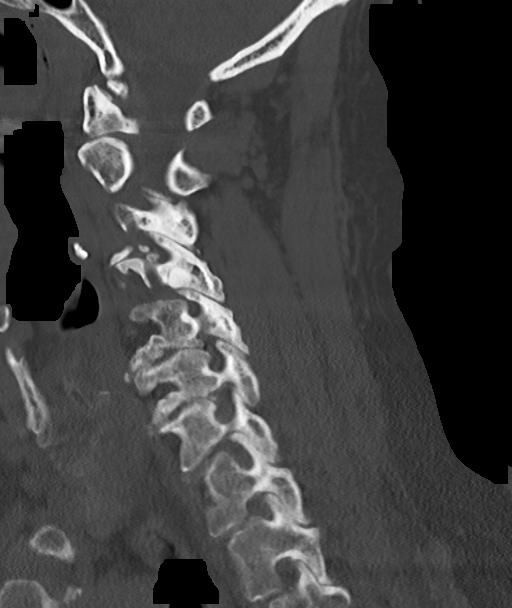
[im 31/51  bone]
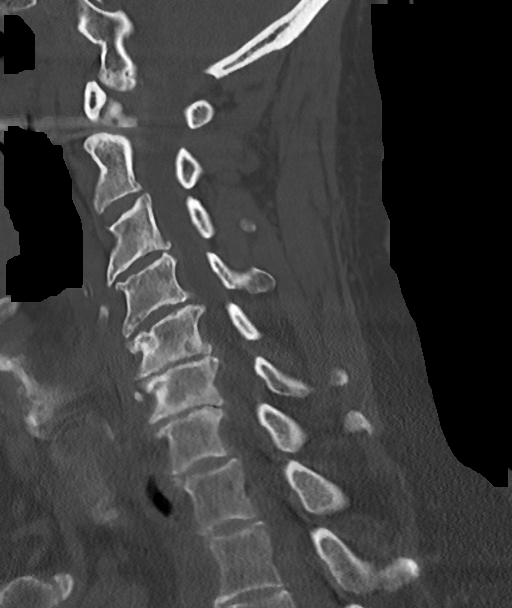
[im 41/51  bone]
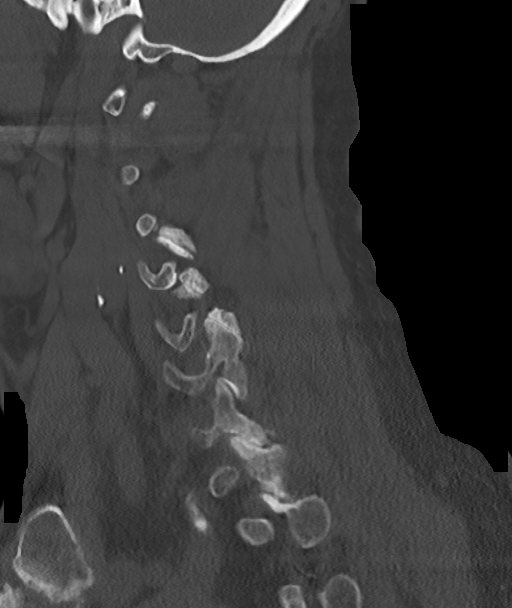

[Series 11: cor bone · coronal · 0.29mm/px · 1 of 51 slices shown]
[im 26/51  bone]
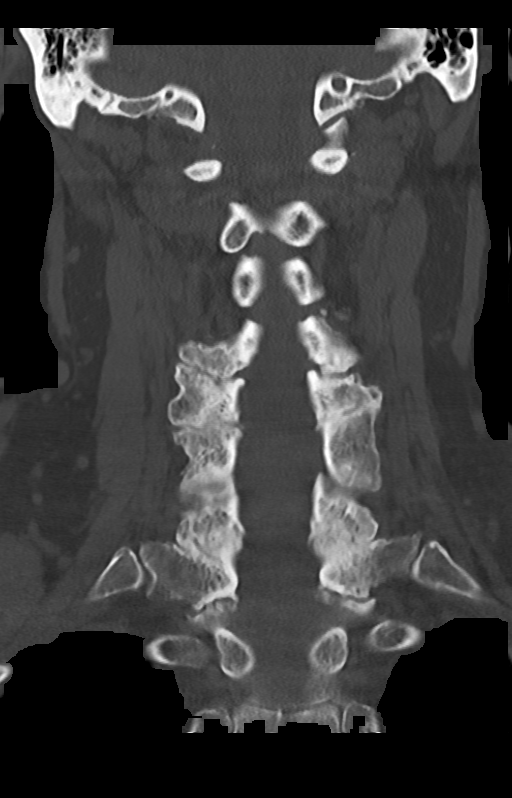

[Series 12: orthogonal axials · axial · 0.21mm/px · z∈[-204,-147]mm · 2 of 96 slices shown, 3 images]
[im 32/96  soft-tissue]
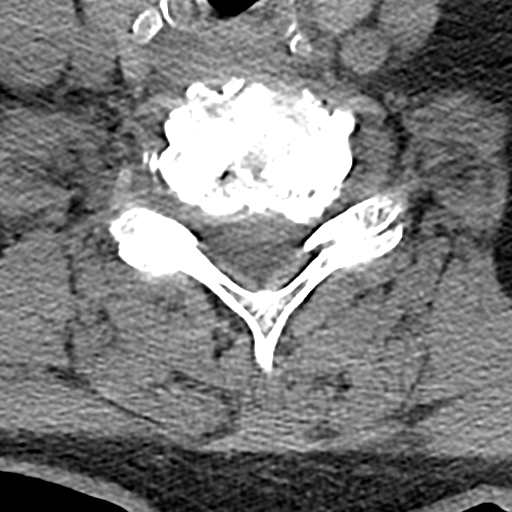
[im 32/96  bone]
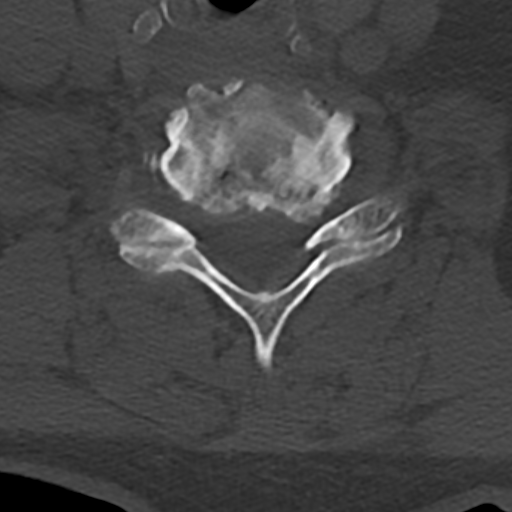
[im 64/96  bone]
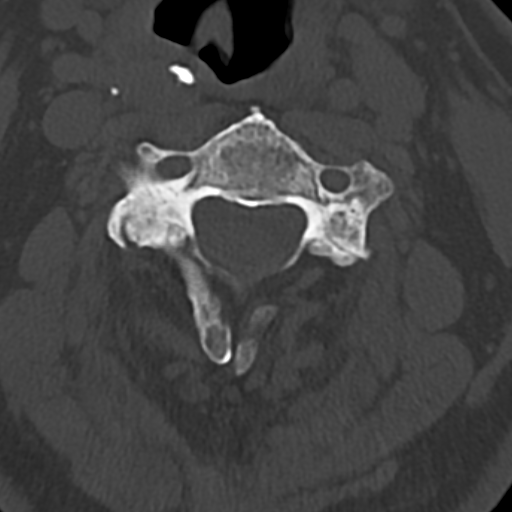

[11 of 33 positions shown; findings below may reference images not displayed]

FINDINGS: CT HEAD FINDINGS

Brain: Mild atrophy and chronic small vessel ischemia, stable and
unchanged from prior exam. No intracranial hemorrhage, mass effect,
or midline shift. No hydrocephalus. The basilar cisterns are patent.
No evidence of territorial infarct or acute ischemia. No extra-axial
or intracranial fluid collection.

Vascular: No hyperdense vessel or unexpected calcification.

Skull: No fracture or focal lesion.

Sinuses/Orbits: Paranasal sinuses and mastoid air cells are clear.
The visualized orbits are unremarkable.

Other: None.

CT CERVICAL SPINE FINDINGS

Alignment: No traumatic subluxation. Straightening of normal
lordosis. Minimal anterolisthesis of C4 on C5 and C5 on C6 is likely
degenerative.

Skull base and vertebrae: No acute fracture. Vertebral body heights
are maintained. The dens and skull base are intact.

Soft tissues and spinal canal: No prevertebral fluid or swelling. No
visible canal hematoma.

Disc levels: Diffuse disc space narrowing and endplate spurring
throughout. Posterior disc osteophyte complex at C5-C6 and C6-C7
causing mild mass-effect on the spinal canal. Moderate multilevel
facet arthropathy.

Upper chest: Carotid calcifications with medial course of the right
carotid artery. Calcified granuloma at the right lung apex.

Other: None.
IMPRESSION: 1.  No acute intracranial abnormality.  No skull fracture.
2. Multilevel degenerative change throughout the cervical spine
without acute fracture or traumatic subluxation.

## 2019-06-18 DIAGNOSIS — R748 Abnormal levels of other serum enzymes: Secondary | ICD-10-CM | POA: Diagnosis not present

## 2019-06-18 DIAGNOSIS — R7989 Other specified abnormal findings of blood chemistry: Secondary | ICD-10-CM | POA: Diagnosis not present

## 2019-06-30 DIAGNOSIS — Z86010 Personal history of colonic polyps: Secondary | ICD-10-CM | POA: Diagnosis not present

## 2019-06-30 DIAGNOSIS — R7989 Other specified abnormal findings of blood chemistry: Secondary | ICD-10-CM | POA: Diagnosis not present

## 2019-06-30 DIAGNOSIS — R740 Nonspecific elevation of levels of transaminase and lactic acid dehydrogenase [LDH]: Secondary | ICD-10-CM | POA: Diagnosis not present

## 2019-06-30 DIAGNOSIS — R198 Other specified symptoms and signs involving the digestive system and abdomen: Secondary | ICD-10-CM | POA: Diagnosis not present

## 2020-01-07 ENCOUNTER — Telehealth: Payer: Self-pay | Admitting: *Deleted

## 2020-01-07 NOTE — Telephone Encounter (Signed)
Mr.Kohan refused appointment at this time,recall deferred.

## 2020-02-08 DIAGNOSIS — E7800 Pure hypercholesterolemia, unspecified: Secondary | ICD-10-CM | POA: Diagnosis not present

## 2020-02-08 DIAGNOSIS — I10 Essential (primary) hypertension: Secondary | ICD-10-CM | POA: Diagnosis not present

## 2020-02-08 DIAGNOSIS — R413 Other amnesia: Secondary | ICD-10-CM | POA: Diagnosis not present

## 2020-02-08 DIAGNOSIS — I350 Nonrheumatic aortic (valve) stenosis: Secondary | ICD-10-CM | POA: Diagnosis not present

## 2020-02-08 DIAGNOSIS — R7989 Other specified abnormal findings of blood chemistry: Secondary | ICD-10-CM | POA: Diagnosis not present

## 2020-02-13 ENCOUNTER — Ambulatory Visit: Payer: Medicare Other | Attending: Internal Medicine

## 2020-02-13 DIAGNOSIS — Z23 Encounter for immunization: Secondary | ICD-10-CM | POA: Insufficient documentation

## 2020-02-13 NOTE — Progress Notes (Signed)
   Covid-19 Vaccination Clinic  Name:  Darryl Page    MRN: [id number] DOB: 04-17-1935  02/13/2020  Mr. Darryl Page was observed post Covid-19 immunization for 15 minutes without incidence. He was provided with Vaccine Information Sheet and instruction to access the V-Safe system.   Mr. Darryl Page was instructed to call 911 with any severe reactions post vaccine: Marland Kitchen Difficulty breathing  . Swelling of your face and throat  . A fast heartbeat  . A bad rash all over your body  . Dizziness and weakness    Immunizations Administered    Name Date Dose VIS Date Route   Pfizer COVID-19 Vaccine 02/13/2020  1:57 PM 0.3 mL 12/11/2019 Intramuscular   Manufacturer: [place]   Lot: [id number]   [place]: [id number]

## 2020-02-29 DIAGNOSIS — D582 Other hemoglobinopathies: Secondary | ICD-10-CM | POA: Diagnosis not present

## 2020-02-29 DIAGNOSIS — R413 Other amnesia: Secondary | ICD-10-CM | POA: Diagnosis not present

## 2020-02-29 DIAGNOSIS — I350 Nonrheumatic aortic (valve) stenosis: Secondary | ICD-10-CM | POA: Diagnosis not present

## 2020-02-29 DIAGNOSIS — E7800 Pure hypercholesterolemia, unspecified: Secondary | ICD-10-CM | POA: Diagnosis not present

## 2020-02-29 DIAGNOSIS — I10 Essential (primary) hypertension: Secondary | ICD-10-CM | POA: Diagnosis not present

## 2020-03-07 ENCOUNTER — Ambulatory Visit: Payer: Medicare Other | Attending: Internal Medicine

## 2020-03-07 DIAGNOSIS — Z23 Encounter for immunization: Secondary | ICD-10-CM | POA: Insufficient documentation

## 2020-03-07 NOTE — Progress Notes (Signed)
   Covid-19 Vaccination Clinic  Name:  Darryl Page    MRN: [id number] DOB: November 10, 1935  03/07/2020  Darryl Page was observed post Covid-19 immunization for 15 minutes without incident. He was provided with Vaccine Information Sheet and instruction to access the V-Safe system.   Darryl Page was instructed to call 911 with any severe reactions post vaccine: Marland Kitchen Difficulty breathing  . Swelling of face and throat  . A fast heartbeat  . A bad rash all over body  . Dizziness and weakness   Immunizations Administered    Name Date Dose VIS Date Route   Pfizer COVID-19 Vaccine 03/07/2020 11:00 AM 0.3 mL 12/11/2019 Intramuscular   Manufacturer: [place]   Lot: [id number]   [place]: [id number]

## 2020-06-03 ENCOUNTER — Ambulatory Visit (INDEPENDENT_AMBULATORY_CARE_PROVIDER_SITE_OTHER): Payer: Medicare Other | Admitting: Cardiology

## 2020-06-03 ENCOUNTER — Other Ambulatory Visit: Payer: Self-pay

## 2020-06-03 ENCOUNTER — Encounter: Payer: Self-pay | Admitting: Cardiology

## 2020-06-03 VITALS — BP 118/80 | HR 68 | Temp 97.2°F | Ht 71.0 in | Wt 188.0 lb

## 2020-06-03 DIAGNOSIS — I350 Nonrheumatic aortic (valve) stenosis: Secondary | ICD-10-CM

## 2020-06-03 DIAGNOSIS — I10 Essential (primary) hypertension: Secondary | ICD-10-CM

## 2020-06-03 DIAGNOSIS — E785 Hyperlipidemia, unspecified: Secondary | ICD-10-CM

## 2020-06-03 DIAGNOSIS — I872 Venous insufficiency (chronic) (peripheral): Secondary | ICD-10-CM

## 2020-06-03 NOTE — Patient Instructions (Signed)
Medication Instructions:  No changes  *If you need a refill on your cardiac medications before your next appointment, please call your pharmacy*   Lab Work: Not needed If you have labs (blood work) drawn today and your tests are completely normal, you will receive your results only by: Marland Kitchen MyChart Message (if you have MyChart) OR . A paper copy in the mail If you have any lab test that is abnormal or we need to change your treatment, we will call you to review the results.   Testing/Procedures: Will be schedule at [facility]  [address]- Oct 2021 Your physician has requested that you have an echocardiogram. Echocardiography is a painless test that uses sound waves to create images of your heart. It provides your doctor with information about the size and shape of your heart and how well your heart's chambers and valves are working. This procedure takes approximately one hour. There are no restrictions for this procedure.    Follow-Up: At [facility], you and your health needs are our priority.  As part of our continuing mission to provide you with exceptional heart care, we have created designated Provider Care Teams.  These Care Teams include your primary Cardiologist (physician) and Advanced Practice Providers (APPs -  Physician Assistants and Nurse Practitioners) who all work together to provide you with the care you need, when you need it.  We recommend signing up for the patient portal called "MyChart".  Sign up information is provided on this After Visit Summary.  MyChart is used to connect with patients for Virtual Visits (Telemedicine).  Patients are able to view lab/test results, encounter notes, upcoming appointments, etc.  Non-urgent messages can be sent to your provider as well.   To learn more about what you can do with MyChart, go to [email or web address].    Your next appointment:   6 month(s) dec 2021  The format for your next appointment:   In  Person  Provider:   Glenetta Hew, MD

## 2020-06-03 NOTE — Progress Notes (Signed)
Primary Care Provider: Leighton Ruff, MD Cardiologist: No primary care provider on file. Electrophysiologist: None  Clinic Note: Chief Complaint  Patient presents with  . Follow-up    2-1/2-year  . Aortic Stenosis    Last echo was in 2018    HPI:    Darryl Page is a 85 y.o. male with a PMH below who presents today for 2-1/2-year follow-up for aortic stenosis.  Koji L Kitt was last seen on October 17, 2017 (at that time a 2-year follow-up) he was doing very well.  From a cardiac standpoint he was quite stable, with no active symptoms.  He said he was not doing any more running like he used to, but was walking most days.  Recent Hospitalizations: None  Reviewed  CV studies:    The following studies were reviewed today: (if available, images/films reviewed: From Epic Chart or Care Everywhere) . Echo 09/2017: EF 65 to 70%.  Vigorous function.  Severe focal basal septal hypertrophy.  Gr1 DD.  Moderate aortic stenosis (peak velocity 328 cm/S, mean gradient 22 mmHg).  Mildly dilated ascending aorta 41 mm.  (No significant change from 2017.   Interval History:   BALTHAZAR DOOLY returns here today for delayed follow-up still doing okay.  But where his last visit he was no longer running routinely, now he is not walking as much either.  He also stopped going to the gym, I gave up his gym membership.  When asked him why he just said he was not going very much.  Seems more like a lack of desire than lack of ability.  Mostly says he is been bothered by left knee and leg pain that keeps him from walking very much.  He still gets around those things including yard work, just not exercising as much she was. Seems relatively asymptomatic from a cardiac standpoint.  Cardiovascular Review of Symptoms (Summary): no chest pain or dyspnea on exertion positive for - Mild edema and now that he is not walking as much he will get little more short of breath than usual with routine  exercise. negative for - chest pain, irregular heartbeat, orthopnea, palpitations, paroxysmal nocturnal dyspnea, rapid heart rate, shortness of breath or Lightheadedness, dizziness, wooziness, syncope/near syncope, TIA/amaurosis fugax, claudication  The patient does not have symptoms concerning for COVID-19 infection (fever, chills, cough, or new shortness of breath).  The patient is practicing social distancing & Masking.  Immunization History  Administered Date(s) Administered  . PFIZER SARS-COV-2 Vaccination 02/13/2020, 03/07/2020    REVIEWED OF SYSTEMS   Review of Systems  Constitutional: Positive for weight loss (Not eating as much). Negative for malaise/fatigue (Not as much energy as he used to have.).  HENT: Negative for congestion and nosebleeds.   Respiratory: Positive for shortness of breath (If he overexerts). Negative for cough and wheezing.   Gastrointestinal: Negative for blood in stool, constipation and melena.  Genitourinary: Negative for hematuria.  Musculoskeletal: Positive for joint pain (Left knee and leg). Negative for falls.  Neurological: Positive for focal weakness (Left leg is now weaker, partly because of discomfort). Negative for dizziness and headaches.  Psychiatric/Behavioral: Positive for memory loss. Negative for depression. The patient is not nervous/anxious and does not have insomnia.    I have reviewed and (if needed) personally updated the patient's problem list, medications, allergies, past medical and surgical history, social and family history.   PAST MEDICAL HISTORY   Past Medical History:  Diagnosis Date  . Essential hypertension   .  History of DVT of lower extremity 2009   LEFT COMMON FEMORAL VEINS AND LEFT POSTERIOR TIBAL VEIS IN THE CALF;  stop taking  warfarin in 2010  . Hyperlipidemia LDL goal <100   . Memory difficulty 07/30/2017  . Moderate aortic stenosis by prior echocardiography 10/2016   Progression from mild to moderate (2015 - 2017  Echo)    PAST SURGICAL HISTORY   Past Surgical History:  Procedure Laterality Date  . BILATERAL LOWER ATERIAL DOPPLER  10/23/2011   BILATERAL ABIs normal 1.2;  . left lower venous  doppler  10/23/2011; 09/24/2014   a. L CFV - visble thrombus noted wthin the vessel without complete compressibilty,the complete common femoral vein demonstrate excellent flow throughout consistent with chronic disease; left popliteal vein -visble thrombus;demonstrares excellent flow troughout consistent with chronic disease;; b. 08/2014: L Lower Extr - no acute or chronic thrombosis or thrombophlebitis. Deep venous insufficiency  . Lower Extremity Venous Dopplers  05/2010   Chronic left popliteal venous thrombosis and acute small saphenous vein thrombosis in the left.  . TRANSTHORACIC ECHOCARDIOGRAM  10/2016   Normal LV size and function (likely vigorous). EF 65-70% with normal wall motion. GR1 DD.  Marland Kitchen TRANSTHORACIC ECHOCARDIOGRAM  07/05/2008   EF =>55%; MILD MITRAL REGURG. Mild aortic valve calcification    MEDICATIONS/ALLERGIES   No outpatient medications have been marked as taking for the 06/03/20 encounter (Office Visit) with Leonie Man, MD.    Allergies  Allergen Reactions  . Ace Inhibitors Cough    SOCIAL HISTORY/FAMILY HISTORY   Reviewed in Epic:  Pertinent findings: Gave up his gym membership; not walking as much as he used to.  No longer doing the long daily walks.  OBJCTIVE -PE, EKG, labs   Wt Readings from Last 3 Encounters:  06/03/20 188 lb (85.3 kg)  07/26/18 203 lb (92.1 kg)  10/17/17 203 lb (92.1 kg)    Physical Exam: BP 118/80 (BP Location: Left Arm, Patient Position: Sitting, Cuff Size: Normal)   Pulse 68   Temp (!) 97.2 F (36.2 C)   Ht 5\' 11"  (1.803 m)   Wt 188 lb (85.3 kg)   SpO2 96%   BMI 26.22 kg/m  Physical Exam Constitutional:      Appearance: Normal appearance. He is normal weight.     Comments: Healthy-appearing.  Well-groomed.  HENT:     Head:  Normocephalic and atraumatic.     Comments: Very hard of hearing Neck:     Vascular: Normal carotid pulses (Mildly delayed upstroke). No carotid bruit (Radiated aortic murmur) or JVD.  Cardiovascular:     Rate and Rhythm: Normal rate and regular rhythm. Occasional extrasystoles are present.    Chest Wall: PMI is not displaced.     Pulses: Intact distal pulses.     Heart sounds: Murmur heard.  Harsh crescendo-decrescendo mid to late systolic murmur is present with a grade of 2/6 at the upper right sternal border and upper left sternal border radiating to the neck.  No friction rub. No gallop.   Pulmonary:     Effort: Pulmonary effort is normal. No respiratory distress.     Breath sounds: Normal breath sounds. No wheezing, rhonchi or rales.  Chest:     Chest wall: No tenderness.  Abdominal:     General: Bowel sounds are normal. There is no distension.     Palpations: Abdomen is soft.     Tenderness: There is no abdominal tenderness.  Musculoskeletal:        General: Swelling (  Mild left leg swelling 1+) present. Normal range of motion.     Cervical back: Normal range of motion and neck supple.  Neurological:     General: No focal deficit present.     Mental Status: He is alert and oriented to person, place, and time.     Comments: Mild memory issues-relatively poor historian  Psychiatric:        Mood and Affect: Mood normal.        Behavior: Behavior normal.        Thought Content: Thought content normal.        Judgment: Judgment normal.      Adult ECG Report  Rate: 68;  Rhythm: normal sinus rhythm, premature atrial contractions (PAC) and 1 degree AVB.  Left ex deviation with right bundle-branch block.  LVH.  Cannot exclude inferior MI, age undetermined.;   Narrative Interpretation: Stable  Recent Labs: February 29, 2020: TC 114, TG 84, HDL 38, LDL 60.  Hgb 17.1. Cr 0.95, K+ 4.1.  TSH 2.12.  ASSESSMENT/PLAN    Problem List Items Addressed This Visit    Deep Venous insufficiency  of left lower extremity; without ulceration or inflammation (Chronic)    This is still residual issue with him having chest discomfort.  We talked about wearing compression stockings and foot elevation.  I think this may be the reason why he is on full dose aspirin.  Recommendation would be to reduce to 81 mg.      Hyperlipidemia with target LDL less than 70 (Chronic)    Labs look great.  LDL 60 on current dose of atorvastatin.  No issues.      Essential hypertension (Chronic)    Blood pressure well controlled today on combination ARB-HCTZ.  Tolerating well without any dizziness to suggest orthostatic hypotension.      Relevant Orders   EKG 12-Lead (Completed)   Moderate aortic stenosis - Primary (Chronic)    It has been 2-1/2 years since his last echo.  I think we can wait till October of this year to recheck echo. By exam it does seem that it may have progressed some. He is not having any symptoms to suggest that is severe.  We discussed concerning signs and symptoms of progression to severe symptomatic AS.      Relevant Orders   EKG 12-Lead (Completed)   ECHOCARDIOGRAM COMPLETE       COVID-19 Education: The signs and symptoms of COVID-19 were discussed with the patient and how to seek care for testing (follow up with PCP or arrange E-visit).   The importance of social distancing and COVID-19 vaccination was discussed today.  I spent a total of 60minutes with the patient. >  50% of the time was spent in direct patient consultation.  Additional time spent with chart review  / charting (studies, outside notes, etc): 6 Total Time: 27 min   Current medicines are reviewed at length with the patient today.  (+/- concerns) none  Notice: This dictation was prepared with Dragon dictation along with smaller phrase technology. Any transcriptional errors that result from this process are unintentional and may not be corrected upon review.  Patient Instructions / Medication Changes &  Studies & Tests Ordered   Patient Instructions  Medication Instructions:  No changes  *If you need a refill on your cardiac medications before your next appointment, please call your pharmacy*   Lab Work: Not needed If you have labs (blood work) drawn today and your tests are completely normal,  you will receive your results only by: Marland Kitchen MyChart Message (if you have MyChart) OR . A paper copy in the mail If you have any lab test that is abnormal or we need to change your treatment, we will call you to review the results.   Testing/Procedures: Will be schedule at [facility]  [address]- Oct 2021 Your physician has requested that you have an echocardiogram. Echocardiography is a painless test that uses sound waves to create images of your heart. It provides your doctor with information about the size and shape of your heart and how well your heart's chambers and valves are working. This procedure takes approximately one hour. There are no restrictions for this procedure.    Follow-Up: At [facility], you and your health needs are our priority.  As part of our continuing mission to provide you with exceptional heart care, we have created designated Provider Care Teams.  These Care Teams include your primary Cardiologist (physician) and Advanced Practice Providers (APPs -  Physician Assistants and Nurse Practitioners) who all work together to provide you with the care you need, when you need it.  We recommend signing up for the patient portal called "MyChart".  Sign up information is provided on this After Visit Summary.  MyChart is used to connect with patients for Virtual Visits (Telemedicine).  Patients are able to view lab/test results, encounter notes, upcoming appointments, etc.  Non-urgent messages can be sent to your provider as well.   To learn more about what you can do with MyChart, go to [email or web address].    Your next appointment:   6 month(s) dec 2021  The  format for your next appointment:   In Person  Provider:   Glenetta Hew, MD       Studies Ordered:   Orders Placed This Encounter  Procedures  . EKG 12-Lead  . ECHOCARDIOGRAM COMPLETE     Glenetta Hew, M.D., M.S. Interventional Cardiologist   Pager # [phone] Phone # [phone] [address] [place], [place] [address]   Thank you for choosing Heartcare at [facility]!!

## 2020-06-10 ENCOUNTER — Encounter: Payer: Self-pay | Admitting: Cardiology

## 2020-06-10 NOTE — Assessment & Plan Note (Signed)
This is still residual issue with him having chest discomfort.  We talked about wearing compression stockings and foot elevation.  I think this may be the reason why he is on full dose aspirin.  Recommendation would be to reduce to 81 mg.

## 2020-06-10 NOTE — Assessment & Plan Note (Addendum)
It has been 2-1/2 years since his last echo.  I think we can wait till October of this year to recheck echo. By exam it does seem that it may have progressed some. He is not having any symptoms to suggest that is severe.  We discussed concerning signs and symptoms of progression to severe symptomatic AS.

## 2020-06-10 NOTE — Assessment & Plan Note (Signed)
Blood pressure well controlled today on combination ARB-HCTZ.  Tolerating well without any dizziness to suggest orthostatic hypotension.

## 2020-06-10 NOTE — Assessment & Plan Note (Signed)
Labs look great.  LDL 60 on current dose of atorvastatin.  No issues.

## 2020-09-08 DIAGNOSIS — R0789 Other chest pain: Secondary | ICD-10-CM | POA: Diagnosis not present

## 2020-09-08 DIAGNOSIS — I10 Essential (primary) hypertension: Secondary | ICD-10-CM | POA: Diagnosis not present

## 2020-09-08 DIAGNOSIS — Z23 Encounter for immunization: Secondary | ICD-10-CM | POA: Diagnosis not present

## 2020-09-08 DIAGNOSIS — K5900 Constipation, unspecified: Secondary | ICD-10-CM | POA: Diagnosis not present

## 2020-09-08 DIAGNOSIS — E7800 Pure hypercholesterolemia, unspecified: Secondary | ICD-10-CM | POA: Diagnosis not present

## 2020-09-08 DIAGNOSIS — N4889 Other specified disorders of penis: Secondary | ICD-10-CM | POA: Diagnosis not present

## 2020-09-27 DIAGNOSIS — Z1389 Encounter for screening for other disorder: Secondary | ICD-10-CM | POA: Diagnosis not present

## 2020-09-27 DIAGNOSIS — Z23 Encounter for immunization: Secondary | ICD-10-CM | POA: Diagnosis not present

## 2020-09-27 DIAGNOSIS — Z0000 Encounter for general adult medical examination without abnormal findings: Secondary | ICD-10-CM | POA: Diagnosis not present

## 2020-10-04 ENCOUNTER — Telehealth (HOSPITAL_COMMUNITY): Payer: Self-pay | Admitting: Cardiology

## 2020-10-04 NOTE — Telephone Encounter (Signed)
Patient called and cancelled echocardiogram for 10/06/2020 and does not wish to reschedule. Order will be removed from the [place] and when patient calls back to schedule we will reinstate the order.

## 2020-10-06 ENCOUNTER — Other Ambulatory Visit (HOSPITAL_COMMUNITY): Payer: Medicare Other

## 2020-10-13 ENCOUNTER — Ambulatory Visit: Payer: Medicare Other | Admitting: Cardiology

## 2020-11-11 DIAGNOSIS — Z23 Encounter for immunization: Secondary | ICD-10-CM | POA: Diagnosis not present

## 2020-12-15 ENCOUNTER — Ambulatory Visit: Payer: Medicare Other | Admitting: Cardiology

## 2021-10-03 DIAGNOSIS — Z1389 Encounter for screening for other disorder: Secondary | ICD-10-CM | POA: Diagnosis not present

## 2021-10-03 DIAGNOSIS — Z0000 Encounter for general adult medical examination without abnormal findings: Secondary | ICD-10-CM | POA: Diagnosis not present

## 2021-10-11 DIAGNOSIS — K5900 Constipation, unspecified: Secondary | ICD-10-CM | POA: Diagnosis not present

## 2021-10-11 DIAGNOSIS — I10 Essential (primary) hypertension: Secondary | ICD-10-CM | POA: Diagnosis not present

## 2021-10-11 DIAGNOSIS — I350 Nonrheumatic aortic (valve) stenosis: Secondary | ICD-10-CM | POA: Diagnosis not present

## 2021-10-11 DIAGNOSIS — Z23 Encounter for immunization: Secondary | ICD-10-CM | POA: Diagnosis not present

## 2021-10-11 DIAGNOSIS — E7800 Pure hypercholesterolemia, unspecified: Secondary | ICD-10-CM | POA: Diagnosis not present

## 2021-10-11 DIAGNOSIS — Z86010 Personal history of colonic polyps: Secondary | ICD-10-CM | POA: Diagnosis not present

## 2021-10-11 DIAGNOSIS — Z79899 Other long term (current) drug therapy: Secondary | ICD-10-CM | POA: Diagnosis not present

## 2021-10-11 DIAGNOSIS — Z6827 Body mass index (BMI) 27.0-27.9, adult: Secondary | ICD-10-CM | POA: Diagnosis not present

## 2021-10-11 DIAGNOSIS — R945 Abnormal results of liver function studies: Secondary | ICD-10-CM | POA: Diagnosis not present

## 2021-11-06 DIAGNOSIS — Z20822 Contact with and (suspected) exposure to covid-19: Secondary | ICD-10-CM | POA: Diagnosis not present

## 2021-11-30 DIAGNOSIS — Z20828 Contact with and (suspected) exposure to other viral communicable diseases: Secondary | ICD-10-CM | POA: Diagnosis not present

## 2021-12-27 DIAGNOSIS — Z20822 Contact with and (suspected) exposure to covid-19: Secondary | ICD-10-CM | POA: Diagnosis not present

## 2022-01-19 DIAGNOSIS — Z20822 Contact with and (suspected) exposure to covid-19: Secondary | ICD-10-CM | POA: Diagnosis not present

## 2022-02-12 DIAGNOSIS — Z20822 Contact with and (suspected) exposure to covid-19: Secondary | ICD-10-CM | POA: Diagnosis not present

## 2022-02-23 DIAGNOSIS — Z20822 Contact with and (suspected) exposure to covid-19: Secondary | ICD-10-CM | POA: Diagnosis not present

## 2022-03-08 DIAGNOSIS — Z20822 Contact with and (suspected) exposure to covid-19: Secondary | ICD-10-CM | POA: Diagnosis not present

## 2022-04-10 DIAGNOSIS — E7800 Pure hypercholesterolemia, unspecified: Secondary | ICD-10-CM | POA: Diagnosis not present

## 2022-04-10 DIAGNOSIS — Z87898 Personal history of other specified conditions: Secondary | ICD-10-CM | POA: Diagnosis not present

## 2022-04-10 DIAGNOSIS — I10 Essential (primary) hypertension: Secondary | ICD-10-CM | POA: Diagnosis not present

## 2022-04-14 DIAGNOSIS — Z20822 Contact with and (suspected) exposure to covid-19: Secondary | ICD-10-CM | POA: Diagnosis not present

## 2022-04-23 DIAGNOSIS — Z20822 Contact with and (suspected) exposure to covid-19: Secondary | ICD-10-CM | POA: Diagnosis not present

## 2022-04-25 DIAGNOSIS — Z20822 Contact with and (suspected) exposure to covid-19: Secondary | ICD-10-CM | POA: Diagnosis not present

## 2022-04-26 DIAGNOSIS — Z20822 Contact with and (suspected) exposure to covid-19: Secondary | ICD-10-CM | POA: Diagnosis not present

## 2022-04-30 DIAGNOSIS — Z20822 Contact with and (suspected) exposure to covid-19: Secondary | ICD-10-CM | POA: Diagnosis not present

## 2022-05-07 DIAGNOSIS — Z20822 Contact with and (suspected) exposure to covid-19: Secondary | ICD-10-CM | POA: Diagnosis not present

## 2022-05-08 DIAGNOSIS — Z20822 Contact with and (suspected) exposure to covid-19: Secondary | ICD-10-CM | POA: Diagnosis not present

## 2022-08-20 DIAGNOSIS — R350 Frequency of micturition: Secondary | ICD-10-CM | POA: Diagnosis not present

## 2022-10-01 DIAGNOSIS — R197 Diarrhea, unspecified: Secondary | ICD-10-CM | POA: Diagnosis not present

## 2022-10-01 DIAGNOSIS — K5989 Other specified functional intestinal disorders: Secondary | ICD-10-CM | POA: Diagnosis not present

## 2023-06-10 DIAGNOSIS — I358 Other nonrheumatic aortic valve disorders: Secondary | ICD-10-CM | POA: Diagnosis not present

## 2023-06-10 DIAGNOSIS — R413 Other amnesia: Secondary | ICD-10-CM | POA: Diagnosis not present

## 2023-06-10 DIAGNOSIS — I10 Essential (primary) hypertension: Secondary | ICD-10-CM | POA: Diagnosis not present

## 2023-06-10 DIAGNOSIS — Z0000 Encounter for general adult medical examination without abnormal findings: Secondary | ICD-10-CM | POA: Diagnosis not present

## 2023-06-10 DIAGNOSIS — E7800 Pure hypercholesterolemia, unspecified: Secondary | ICD-10-CM | POA: Diagnosis not present

## 2023-06-10 DIAGNOSIS — Z23 Encounter for immunization: Secondary | ICD-10-CM | POA: Diagnosis not present

## 2023-06-10 DIAGNOSIS — K5900 Constipation, unspecified: Secondary | ICD-10-CM | POA: Diagnosis not present

## 2023-08-13 ENCOUNTER — Encounter: Payer: Self-pay | Admitting: Nurse Practitioner

## 2023-08-13 ENCOUNTER — Non-Acute Institutional Stay: Payer: Medicare Other | Admitting: Family Medicine

## 2023-08-13 DIAGNOSIS — I10 Essential (primary) hypertension: Secondary | ICD-10-CM | POA: Diagnosis not present

## 2023-08-13 DIAGNOSIS — E785 Hyperlipidemia, unspecified: Secondary | ICD-10-CM

## 2023-08-13 DIAGNOSIS — R413 Other amnesia: Secondary | ICD-10-CM | POA: Diagnosis not present

## 2023-08-13 DIAGNOSIS — I350 Nonrheumatic aortic (valve) stenosis: Secondary | ICD-10-CM

## 2023-08-13 NOTE — Progress Notes (Signed)
This encounter was created in error - please disregard.

## 2023-08-13 NOTE — Progress Notes (Unsigned)
Provider:  Jacalyn Lefevre, MD Location:   [facility]   Place of Service:  [facility]   PCP: Mast, Man X, NP Patient Care Team: Mast, Man X, NP as PCP - General (Internal Medicine)  Extended Emergency Contact Information Primary Emergency Contact: Blenda Peals [place] Home Phone: [phone] Mobile Phone: [phone] Relation: Daughter  Code Status:  Goals of Care: Advanced Directive information     No data to display            No chief complaint on file.   HPI: Patient is a 88 y.o. male seen today [facility].  Patient had been living by himself in private residence.  Wife died about 6-1/2 years ago.  He is accompanied today by his son and daughter, son lives locally, a daughter in [place].  They have noted increasing issues with memory.  He gave up driving earlier this year.  There have been no issues with ADLs such as dressing bathing.  For his meals he eats a meal which not does not have to be prepared for breakfast and lunch and then his daughter orders meals from restaurants at dinnertime.  Weight has been stable.  There have been no falls. Basically his health is good; he does have hypertension, hyperlipidemia, and moderate aortic stenosis, along with memory decline.  He first saw a neurologist in 2018.  Referred to neurology by PCP for his memory. There is a history of DVT, but he is not on anticoagulation. There is also a history of alcohol intoxication and a fall back in the remote past.  Family does not mention any issues with alcohol more recently.  Past Medical History:  Diagnosis Date   Essential hypertension    History of DVT of lower extremity 2009   LEFT COMMON FEMORAL VEINS AND LEFT POSTERIOR TIBAL VEIS IN THE CALF;  stop taking  warfarin in 2010   Hyperlipidemia LDL goal <100    Memory difficulty 07/30/2017   Moderate aortic stenosis by prior echocardiography 10/2016   Progression  from mild to moderate (2015 - 2017 Echo)   Past Surgical History:  Procedure Laterality Date   BILATERAL LOWER ATERIAL DOPPLER  10/23/2011   BILATERAL ABIs normal 1.2;   left lower venous  doppler  10/23/2011; 09/24/2014   a. L CFV - visble thrombus noted wthin the vessel without complete compressibilty,the complete common femoral vein demonstrate excellent flow throughout consistent with chronic disease; left popliteal vein -visble thrombus;demonstrares excellent flow troughout consistent with chronic disease;; b. 08/2014: L Lower Extr - no acute or chronic thrombosis or thrombophlebitis. Deep venous insufficiency   Lower Extremity Venous Dopplers  05/2010   Chronic left popliteal venous thrombosis and acute small saphenous vein thrombosis in the left.   TRANSTHORACIC ECHOCARDIOGRAM  10/2016   Normal LV size and function (likely vigorous). EF 65-70% with normal wall motion. GR1 DD.   TRANSTHORACIC ECHOCARDIOGRAM  07/05/2008   EF =>55%; MILD MITRAL REGURG. Mild aortic valve calcification    reports that he has quit smoking. He has never used smokeless tobacco. He reports current alcohol use. He reports that he does not use drugs. Social History   Socioeconomic History   Marital status: Married    Spouse name: Not on file   Number of children: 2   Years of education: College   Highest education level: Not on file  Occupational History   Occupation: Retired  Tobacco Use   Smoking status: Former  Smokeless tobacco: Never  Substance and Sexual Activity   Alcohol use: Yes    Comment: 2-3 glasses    Drug use: No   Sexual activity: Not on file  Other Topics Concern   Not on file  Social History Narrative   SH: Live alone   - 2 children, 3 GC.     Usually exercises ~3 x week with long walks.    Quit smoking in his 79s.     2 glasses of wine/day   Caffeine use: 1 cup coffee every morning   Right handed   Social Determinants of Health   Financial Resource Strain: Not on file   Food Insecurity: Not on file  Transportation Needs: Not on file  Physical Activity: Not on file  Stress: Not on file  Social Connections: Not on file  Intimate Partner Violence: Not on file    Functional Status Survey:    No family history on file.  Health Maintenance  Topic Date Due   DTaP/Tdap/Td (1 - Tdap) Never done   Zoster Vaccines- Shingrix (1 of 2) Never done   Pneumonia Vaccine 109+ Years old (1 of 1 - PCV) Never done   COVID-19 Vaccine (3 - Pfizer risk series) 04/04/2020   Medicare Annual Wellness (AWV)  10/03/2022   INFLUENZA VACCINE  08/01/2023   HPV VACCINES  Aged Out    Allergies  Allergen Reactions   Ace Inhibitors Cough    Outpatient Encounter Medications as of 08/13/2023  Medication Sig   aspirin EC 325 MG tablet Take 325 mg by mouth daily.   atorvastatin (LIPITOR) 20 MG tablet Take 1 tablet (20 mg total) by mouth daily. <PLEASE MAKE APPOINTMENT FOR REFILLS>   Glucosamine HCl (GLUCOSAMINE PO) Take 2 tablets by mouth daily.   losartan-hydrochlorothiazide (HYZAAR) 100-25 MG per tablet Take 1 tablet by mouth daily.   Multiple Vitamin (MULTIVITAMIN) tablet Take 1 tablet by mouth daily.   Omega-3 Fatty Acids (FISH OIL) 1000 MG CAPS Take by mouth. TAKE 2 CAPSULES A DAY   No facility-administered encounter medications on file as of 08/13/2023.    Review of Systems  Constitutional: Negative.   Respiratory: Negative.    Cardiovascular: Negative.   Gastrointestinal:  Positive for constipation.  Endocrine: Negative.   Genitourinary: Negative.   Neurological: Negative.   Psychiatric/Behavioral:  Positive for confusion.   All other systems reviewed and are negative.   There were no vitals filed for this visit. There is no height or weight on file to calculate BMI. Physical Exam Vitals and nursing note reviewed.  Constitutional:      Appearance: Normal appearance.  HENT:     Head: Normocephalic.     Mouth/Throat:     Mouth: Mucous membranes are moist.      Pharynx: Oropharynx is clear.  Eyes:     Extraocular Movements: Extraocular movements intact.     Pupils: Pupils are equal, round, and reactive to light.  Cardiovascular:     Rate and Rhythm: Normal rate and regular rhythm.     Heart sounds: Murmur heard.  Pulmonary:     Effort: Pulmonary effort is normal.     Breath sounds: Normal breath sounds.  Abdominal:     General: Bowel sounds are normal.     Palpations: Abdomen is soft.  Musculoskeletal:        General: Normal range of motion.  Skin:    General: Skin is warm and dry.  Neurological:     General: No focal deficit present.  Mental Status: He is alert and oriented to person, place, and time.  Psychiatric:        Mood and Affect: Mood normal.        Behavior: Behavior normal.     Labs reviewed: Basic Metabolic Panel: No results for input(s): "NA", "K", "CL", "CO2", "GLUCOSE", "BUN", "CREATININE", "CALCIUM", "MG", "PHOS" in the last 8760 hours. Liver Function Tests: No results for input(s): "AST", "ALT", "ALKPHOS", "BILITOT", "PROT", "ALBUMIN" in the last 8760 hours. No results for input(s): "LIPASE", "AMYLASE" in the last 8760 hours. No results for input(s): "AMMONIA" in the last 8760 hours. CBC: No results for input(s): "WBC", "NEUTROABS", "HGB", "HCT", "MCV", "PLT" in the last 8760 hours. Cardiac Enzymes: No results for input(s): "CKTOTAL", "CKMB", "CKMBINDEX", "TROPONINI" in the last 8760 hours. BNP: Invalid input(s): "POCBNP" No results found for: "HGBA1C" Lab Results  Component Value Date   TSH 2.854 Test methodology is 3rd generation TSH 06/10/2008   No results found for: "VITAMINB12" No results found for: "FOLATE" No results found for: "IRON", "TIBC", "FERRITIN"  Imaging and Procedures obtained prior to SNF admission: CT Head Wo Contrast  Result Date: 07/26/2018 CLINICAL DATA:  Fall from stool while drinking at a bar striking head. Altered mental status. Altered level of consciousness (LOC),  unexplained; Neck pain, initial exam EXAM: CT HEAD WITHOUT CONTRAST CT CERVICAL SPINE WITHOUT CONTRAST TECHNIQUE: Multidetector CT imaging of the head and cervical spine was performed following the standard protocol without intravenous contrast. Multiplanar CT image reconstructions of the cervical spine were also generated. COMPARISON:  Head CT 06/07/2017 FINDINGS: CT HEAD FINDINGS Brain: Mild atrophy and chronic small vessel ischemia, stable and unchanged from prior exam. No intracranial hemorrhage, mass effect, or midline shift. No hydrocephalus. The basilar cisterns are patent. No evidence of territorial infarct or acute ischemia. No extra-axial or intracranial fluid collection. Vascular: No hyperdense vessel or unexpected calcification. Skull: No fracture or focal lesion. Sinuses/Orbits: Paranasal sinuses and mastoid air cells are clear. The visualized orbits are unremarkable. Other: None. CT CERVICAL SPINE FINDINGS Alignment: No traumatic subluxation. Straightening of normal lordosis. Minimal anterolisthesis of C4 on C5 and C5 on C6 is likely degenerative. Skull base and vertebrae: No acute fracture. Vertebral body heights are maintained. The dens and skull base are intact. Soft tissues and spinal canal: No prevertebral fluid or swelling. No visible canal hematoma. Disc levels: Diffuse disc space narrowing and endplate spurring throughout. Posterior disc osteophyte complex at C5-C6 and C6-C7 causing mild mass-effect on the spinal canal. Moderate multilevel facet arthropathy. Upper chest: Carotid calcifications with medial course of the right carotid artery. Calcified granuloma at the right lung apex. Other: None. IMPRESSION: 1.  No acute intracranial abnormality.  No skull fracture. 2. Multilevel degenerative change throughout the cervical spine without acute fracture or traumatic subluxation. Electronically Signed   By: Rubye Oaks M.D.   On: 07/26/2018 21:56   CT Cervical Spine Wo Contrast  Result  Date: 07/26/2018 CLINICAL DATA:  Fall from stool while drinking at a bar striking head. Altered mental status. Altered level of consciousness (LOC), unexplained; Neck pain, initial exam EXAM: CT HEAD WITHOUT CONTRAST CT CERVICAL SPINE WITHOUT CONTRAST TECHNIQUE: Multidetector CT imaging of the head and cervical spine was performed following the standard protocol without intravenous contrast. Multiplanar CT image reconstructions of the cervical spine were also generated. COMPARISON:  Head CT 06/07/2017 FINDINGS: CT HEAD FINDINGS Brain: Mild atrophy and chronic small vessel ischemia, stable and unchanged from prior exam. No intracranial hemorrhage, mass effect, or midline shift.  No hydrocephalus. The basilar cisterns are patent. No evidence of territorial infarct or acute ischemia. No extra-axial or intracranial fluid collection. Vascular: No hyperdense vessel or unexpected calcification. Skull: No fracture or focal lesion. Sinuses/Orbits: Paranasal sinuses and mastoid air cells are clear. The visualized orbits are unremarkable. Other: None. CT CERVICAL SPINE FINDINGS Alignment: No traumatic subluxation. Straightening of normal lordosis. Minimal anterolisthesis of C4 on C5 and C5 on C6 is likely degenerative. Skull base and vertebrae: No acute fracture. Vertebral body heights are maintained. The dens and skull base are intact. Soft tissues and spinal canal: No prevertebral fluid or swelling. No visible canal hematoma. Disc levels: Diffuse disc space narrowing and endplate spurring throughout. Posterior disc osteophyte complex at C5-C6 and C6-C7 causing mild mass-effect on the spinal canal. Moderate multilevel facet arthropathy. Upper chest: Carotid calcifications with medial course of the right carotid artery. Calcified granuloma at the right lung apex. Other: None. IMPRESSION: 1.  No acute intracranial abnormality.  No skull fracture. 2. Multilevel degenerative change throughout the cervical spine without acute  fracture or traumatic subluxation. Electronically Signed   By: Rubye Oaks M.D.   On: 07/26/2018 21:56   DG Chest Port 1 View  Result Date: 07/26/2018 CLINICAL DATA:  Possible syncope. Fall from stool in a bar striking head. EXAM: PORTABLE CHEST 1 VIEW COMPARISON:  Radiographs 09/24/2008 FINDINGS: Low lung volumes with bibasilar atelectasis. Mild cardiomegaly. Atherosclerosis of the aorta. Bronchovascular crowding without pulmonary edema. No pneumothorax or large pleural effusion. No acute osseous abnormalities are seen. IMPRESSION: Hypoventilatory chest with bibasilar atelectasis. Cardiomegaly likely accentuated by technique. Electronically Signed   By: Rubye Oaks M.D.   On: 07/26/2018 21:37    Assessment/Plan 1. Essential hypertension Blood pressure managed on losartan 100 mg daily and hydrochlorothiazide  2. Hyperlipidemia with target LDL less than 70 Patient takes atorvastatin 20 mg.  I do not see recent check of lipids.  Last LDL 60 in March 2021  3. Memory difficulty Scored 5/15 on BIMS at admission today.  Will follow-up with [facility]  4. Moderate aortic stenosis Followed by cardiology.  Echo from 2018 shows ejection fraction of 65 to 70%    Family/ staff Communication: Son and daughter  Labs/tests ordered: none  Bertram Millard. Hyacinth Meeker, MD [facility] [address] [place], [place] [address] Office [phone]

## 2023-08-13 NOTE — Progress Notes (Signed)
Provider:  Chipper Oman, NP Location:  [facility] Room Number: AL803-A Place of Service:  ALF (13)  PCP: Keyaan Lederman X, NP Patient Care Team: Quinley Nesler X, NP as PCP - General (Internal Medicine)  Extended Emergency Contact Information Primary Emergency Contact: Blenda Peals [place] Home Phone: [phone] Mobile Phone: [phone] Relation: Daughter  Code Status:  Goals of Care: Advanced Directive information     No data to display            Chief Complaint  Patient presents with   Establish Care    New patient to establish care with [facility] and Adult Medicine providers located at [facility]    Error    HPI: Patient is a 88 y.o. male seen today for admission to  Past Medical History:  Diagnosis Date   Essential hypertension    History of DVT of lower extremity 2009   LEFT COMMON FEMORAL VEINS AND LEFT POSTERIOR TIBAL VEIS IN THE CALF;  stop taking  warfarin in 2010   Hyperlipidemia LDL goal <100    Memory difficulty 07/30/2017   Moderate aortic stenosis by prior echocardiography 10/2016   Progression from mild to moderate (2015 - 2017 Echo)   Past Surgical History:  Procedure Laterality Date   BILATERAL LOWER ATERIAL DOPPLER  10/23/2011   BILATERAL ABIs normal 1.2;   left lower venous  doppler  10/23/2011; 09/24/2014   a. L CFV - visble thrombus noted wthin the vessel without complete compressibilty,the complete common femoral vein demonstrate excellent flow throughout consistent with chronic disease; left popliteal vein -visble thrombus;demonstrares excellent flow troughout consistent with chronic disease;; b. 08/2014: L Lower Extr - no acute or chronic thrombosis or thrombophlebitis. Deep venous insufficiency   Lower Extremity Venous Dopplers  05/2010   Chronic left popliteal venous thrombosis and acute small saphenous vein thrombosis in the left.   TRANSTHORACIC ECHOCARDIOGRAM  10/2016   Normal  LV size and function (likely vigorous). EF 65-70% with normal wall motion. GR1 DD.   TRANSTHORACIC ECHOCARDIOGRAM  07/05/2008   EF =>55%; MILD MITRAL REGURG. Mild aortic valve calcification    reports that he has quit smoking. He has never used smokeless tobacco. He reports current alcohol use. He reports that he does not use drugs. Social History   Socioeconomic History   Marital status: Married    Spouse name: Not on file   Number of children: 2   Years of education: College   Highest education level: Not on file  Occupational History   Occupation: Retired  Tobacco Use   Smoking status: Former   Smokeless tobacco: Never  Substance and Sexual Activity   Alcohol use: Yes    Comment: 2-3 glasses    Drug use: No   Sexual activity: Not on file  Other Topics Concern   Not on file  Social History Narrative   SH: Live alone   - 2 children, 3 GC.     Usually exercises ~3 x week with long walks.    Quit smoking in his 43s.     2 glasses of wine/day   Caffeine use: 1 cup coffee every morning   Right handed   Social Determinants of Health   Financial Resource Strain: Not on file  Food Insecurity: Not on file  Transportation Needs: Not on file  Physical Activity: Not on file  Stress: Not on file  Social Connections: Not on file  Intimate Partner Violence: Not on file  Functional Status Survey:    No family history on file.  Health Maintenance  Topic Date Due   DTaP/Tdap/Td (1 - Tdap) Never done   Zoster Vaccines- Shingrix (1 of 2) Never done   Pneumonia Vaccine 1+ Years old (1 of 1 - PCV) Never done   COVID-19 Vaccine (3 - Pfizer risk series) 04/04/2020   Medicare Annual Wellness (AWV)  10/03/2022   INFLUENZA VACCINE  08/01/2023   HPV VACCINES  Aged Out    Allergies  Allergen Reactions   Ace Inhibitors Cough    Outpatient Encounter Medications as of 08/13/2023  Medication Sig   aspirin EC 325 MG tablet Take 325 mg by mouth daily.   atorvastatin (LIPITOR) 20  MG tablet Take 1 tablet (20 mg total) by mouth daily. <PLEASE MAKE APPOINTMENT FOR REFILLS>   Glucosamine HCl (GLUCOSAMINE PO) Take 2 tablets by mouth daily.   losartan-hydrochlorothiazide (HYZAAR) 100-25 MG per tablet Take 1 tablet by mouth daily.   Multiple Vitamin (MULTIVITAMIN) tablet Take 1 tablet by mouth daily.   Omega-3 Fatty Acids (FISH OIL) 1000 MG CAPS Take by mouth. TAKE 2 CAPSULES A DAY   No facility-administered encounter medications on file as of 08/13/2023.    Review of Systems  There were no vitals filed for this visit. There is no height or weight on file to calculate BMI. Physical Exam  Labs reviewed: Basic Metabolic Panel: No results for input(s): "NA", "K", "CL", "CO2", "GLUCOSE", "BUN", "CREATININE", "CALCIUM", "MG", "PHOS" in the last 8760 hours. Liver Function Tests: No results for input(s): "AST", "ALT", "ALKPHOS", "BILITOT", "PROT", "ALBUMIN" in the last 8760 hours. No results for input(s): "LIPASE", "AMYLASE" in the last 8760 hours. No results for input(s): "AMMONIA" in the last 8760 hours. CBC: No results for input(s): "WBC", "NEUTROABS", "HGB", "HCT", "MCV", "PLT" in the last 8760 hours. Cardiac Enzymes: No results for input(s): "CKTOTAL", "CKMB", "CKMBINDEX", "TROPONINI" in the last 8760 hours. BNP: Invalid input(s): "POCBNP" No results found for: "HGBA1C"  No results found for: "VITAMINB12" No results found for: "FOLATE" No results found for: "IRON", "TIBC", "FERRITIN"  Imaging and Procedures obtained prior to SNF admission: CT Head Wo Contrast  Result Date: 07/26/2018 CLINICAL DATA:  Fall from stool while drinking at a bar striking head. Altered mental status. Altered level of consciousness (LOC), unexplained; Neck pain, initial exam EXAM: CT HEAD WITHOUT CONTRAST CT CERVICAL SPINE WITHOUT CONTRAST TECHNIQUE: Multidetector CT imaging of the head and cervical spine was performed following the standard protocol without intravenous contrast.  Multiplanar CT image reconstructions of the cervical spine were also generated. COMPARISON:  Head CT 06/07/2017 FINDINGS: CT HEAD FINDINGS Brain: Mild atrophy and chronic small vessel ischemia, stable and unchanged from prior exam. No intracranial hemorrhage, mass effect, or midline shift. No hydrocephalus. The basilar cisterns are patent. No evidence of territorial infarct or acute ischemia. No extra-axial or intracranial fluid collection. Vascular: No hyperdense vessel or unexpected calcification. Skull: No fracture or focal lesion. Sinuses/Orbits: Paranasal sinuses and mastoid air cells are clear. The visualized orbits are unremarkable. Other: None. CT CERVICAL SPINE FINDINGS Alignment: No traumatic subluxation. Straightening of normal lordosis. Minimal anterolisthesis of C4 on C5 and C5 on C6 is likely degenerative. Skull base and vertebrae: No acute fracture. Vertebral body heights are maintained. The dens and skull base are intact. Soft tissues and spinal canal: No prevertebral fluid or swelling. No visible canal hematoma. Disc levels: Diffuse disc space narrowing and endplate spurring throughout. Posterior disc osteophyte complex at C5-C6 and C6-C7 causing mild  mass-effect on the spinal canal. Moderate multilevel facet arthropathy. Upper chest: Carotid calcifications with medial course of the right carotid artery. Calcified granuloma at the right lung apex. Other: None. IMPRESSION: 1.  No acute intracranial abnormality.  No skull fracture. 2. Multilevel degenerative change throughout the cervical spine without acute fracture or traumatic subluxation. Electronically Signed   By: Rubye Oaks M.D.   On: 07/26/2018 21:56   CT Cervical Spine Wo Contrast  Result Date: 07/26/2018 CLINICAL DATA:  Fall from stool while drinking at a bar striking head. Altered mental status. Altered level of consciousness (LOC), unexplained; Neck pain, initial exam EXAM: CT HEAD WITHOUT CONTRAST CT CERVICAL SPINE WITHOUT  CONTRAST TECHNIQUE: Multidetector CT imaging of the head and cervical spine was performed following the standard protocol without intravenous contrast. Multiplanar CT image reconstructions of the cervical spine were also generated. COMPARISON:  Head CT 06/07/2017 FINDINGS: CT HEAD FINDINGS Brain: Mild atrophy and chronic small vessel ischemia, stable and unchanged from prior exam. No intracranial hemorrhage, mass effect, or midline shift. No hydrocephalus. The basilar cisterns are patent. No evidence of territorial infarct or acute ischemia. No extra-axial or intracranial fluid collection. Vascular: No hyperdense vessel or unexpected calcification. Skull: No fracture or focal lesion. Sinuses/Orbits: Paranasal sinuses and mastoid air cells are clear. The visualized orbits are unremarkable. Other: None. CT CERVICAL SPINE FINDINGS Alignment: No traumatic subluxation. Straightening of normal lordosis. Minimal anterolisthesis of C4 on C5 and C5 on C6 is likely degenerative. Skull base and vertebrae: No acute fracture. Vertebral body heights are maintained. The dens and skull base are intact. Soft tissues and spinal canal: No prevertebral fluid or swelling. No visible canal hematoma. Disc levels: Diffuse disc space narrowing and endplate spurring throughout. Posterior disc osteophyte complex at C5-C6 and C6-C7 causing mild mass-effect on the spinal canal. Moderate multilevel facet arthropathy. Upper chest: Carotid calcifications with medial course of the right carotid artery. Calcified granuloma at the right lung apex. Other: None. IMPRESSION: 1.  No acute intracranial abnormality.  No skull fracture. 2. Multilevel degenerative change throughout the cervical spine without acute fracture or traumatic subluxation. Electronically Signed   By: Rubye Oaks M.D.   On: 07/26/2018 21:56   DG Chest Port 1 View  Result Date: 07/26/2018 CLINICAL DATA:  Possible syncope. Fall from stool in a bar striking head. EXAM: PORTABLE  CHEST 1 VIEW COMPARISON:  Radiographs 09/24/2008 FINDINGS: Low lung volumes with bibasilar atelectasis. Mild cardiomegaly. Atherosclerosis of the aorta. Bronchovascular crowding without pulmonary edema. No pneumothorax or large pleural effusion. No acute osseous abnormalities are seen. IMPRESSION: Hypoventilatory chest with bibasilar atelectasis. Cardiomegaly likely accentuated by technique. Electronically Signed   By: Rubye Oaks M.D.   On: 07/26/2018 21:37    Assessment/Plan There are no diagnoses linked to this encounter.   Family/ staff Communication:   Labs/tests ordered:  This encounter was created in error - please disregard.

## 2023-08-14 ENCOUNTER — Encounter: Payer: Self-pay | Admitting: Family Medicine

## 2023-08-16 DIAGNOSIS — R41841 Cognitive communication deficit: Secondary | ICD-10-CM | POA: Diagnosis not present

## 2023-08-21 DIAGNOSIS — R41841 Cognitive communication deficit: Secondary | ICD-10-CM | POA: Diagnosis not present

## 2023-08-29 DIAGNOSIS — R41841 Cognitive communication deficit: Secondary | ICD-10-CM | POA: Diagnosis not present

## 2023-10-22 LAB — COMPREHENSIVE METABOLIC PANEL
Albumin: 4.5 (ref 3.5–5.0)
Calcium: 9.9 (ref 8.7–10.7)
Globulin: 2.8
eGFR: 76

## 2023-10-22 LAB — HEPATIC FUNCTION PANEL
ALT: 27 U/L (ref 10–40)
AST: 21 (ref 14–40)
Alkaline Phosphatase: 64 (ref 25–125)
Bilirubin, Total: 1.2

## 2023-10-22 LAB — BASIC METABOLIC PANEL
BUN: 32 — AB (ref 4–21)
CO2: 30 — AB (ref 13–22)
Chloride: 102 (ref 99–108)
Creatinine: 1 (ref 0.6–1.3)
Glucose: 83
Potassium: 3.8 meq/L (ref 3.5–5.1)
Sodium: 142 (ref 137–147)

## 2023-10-22 LAB — LIPID PANEL
Cholesterol: 144 (ref 0–200)
HDL: 41 (ref 35–70)
LDL Cholesterol: 86
Triglycerides: 80 (ref 40–160)

## 2023-10-22 LAB — CBC AND DIFFERENTIAL
HCT: 52 (ref 41–53)
Hemoglobin: 17.3 (ref 13.5–17.5)
Platelets: 186 10*3/uL (ref 150–400)
WBC: 8.6

## 2023-10-22 LAB — CBC: RBC: 5.6 — AB (ref 3.87–5.11)

## 2023-11-04 ENCOUNTER — Non-Acute Institutional Stay: Payer: Self-pay | Admitting: Nurse Practitioner

## 2023-11-04 ENCOUNTER — Encounter: Payer: Self-pay | Admitting: Nurse Practitioner

## 2023-11-04 DIAGNOSIS — D582 Other hemoglobinopathies: Secondary | ICD-10-CM | POA: Diagnosis not present

## 2023-11-04 DIAGNOSIS — R413 Other amnesia: Secondary | ICD-10-CM | POA: Diagnosis not present

## 2023-11-04 DIAGNOSIS — E785 Hyperlipidemia, unspecified: Secondary | ICD-10-CM | POA: Diagnosis not present

## 2023-11-04 DIAGNOSIS — I10 Essential (primary) hypertension: Secondary | ICD-10-CM

## 2023-11-04 NOTE — Assessment & Plan Note (Signed)
Elevated Hgb 17.3 10/22/23, pending repeated CBC/diff

## 2023-11-04 NOTE — Assessment & Plan Note (Signed)
Atorvastatin, LDL 86 10/22/23

## 2023-11-04 NOTE — Assessment & Plan Note (Signed)
Blood pressure is controlled,  taking Losartan/hydrochlorothiazide, Bun/creat 32/0.97 10/22/23

## 2023-11-04 NOTE — Progress Notes (Unsigned)
Location:   [facility] Room Number: 803-A Place of Service:  ALF [phone]) Provider: Chipper Oman NP  Patient Care Team: Kingslee Dowse X, NP as PCP - General (Internal Medicine)  Extended Emergency Contact Information Primary Emergency Contact: Blenda Peals [place] of [place] Home Phone: [phone] Mobile Phone: [phone] Relation: Daughter  Code Status: DNR Goals of Care: Advanced Directive information    11/04/2023    9:47 AM  Advanced Directives  Does Patient Have a Medical Advance Directive? Yes  Type of Estate agent of [place];Out of facility DNR (pink MOST or yellow form);Living will  Does patient want to make changes to medical advance directive? No - Patient declined  Copy of Healthcare Power of Attorney in Chart? Yes - validated most recent copy scanned in chart (See row information)  Pre-existing out of facility DNR order (yellow form or pink MOST form) Pink MOST/Yellow Form most recent copy in chart - Physician notified to receive inpatient order     Chief Complaint  Patient presents with   Medical Management of Chronic Issues    HPI: Patient is a 88 y.o. male seen in today for managing chronic medication conditions.   Dementia, functioning in [facility]  HLD Atorvastatin, LDL 86 10/22/23  HTN, taking Losartan/hydrochlorothiazide, Bun/creat 32/0.97 10/22/23  Elevated Hgb 17.3 10/22/23, pending repeated CBC/diff        No data to display              No data to display            07/30/2017    9:18 AM  MMSE - Mini Mental State Exam  Orientation to time 4  Orientation to Place 5  Registration 3  Attention/ Calculation 1  Recall 0  Language- name 2 objects 2  Language- repeat 1  Language- follow 3 step command 2  Language- read & follow direction 1  Write a sentence 1  Copy design 0  Total score 20     Health Maintenance  Topic Date Due   DTaP/Tdap/Td (1 - Tdap) Never done   Zoster  Vaccines- Shingrix (1 of 2) Never done   Pneumonia Vaccine 25+ Years old (1 of 1 - PCV) Never done   COVID-19 Vaccine (4 - 2023-24 season) 12/11/2023   Medicare Annual Wellness (AWV)  06/09/2024   INFLUENZA VACCINE  Completed   HPV VACCINES  Aged Out    Urinary incontinence? Functional Status Survey:   Exercise? Diet? No results found. Dentition: Pain:  Past Medical History:  Diagnosis Date   Essential hypertension    History of DVT of lower extremity 2009   LEFT COMMON FEMORAL VEINS AND LEFT POSTERIOR TIBAL VEIS IN THE CALF;  stop taking  warfarin in 2010   Hyperlipidemia LDL goal <100    Memory difficulty 07/30/2017   Moderate aortic stenosis by prior echocardiography 10/2016   Progression from mild to moderate (2015 - 2017 Echo)    Past Surgical History:  Procedure Laterality Date   BILATERAL LOWER ATERIAL DOPPLER  10/23/2011   BILATERAL ABIs normal 1.2;   left lower venous  doppler  10/23/2011; 09/24/2014   a. L CFV - visble thrombus noted wthin the vessel without complete compressibilty,the complete common femoral vein demonstrate excellent flow throughout consistent with chronic disease; left popliteal vein -visble thrombus;demonstrares excellent flow troughout consistent with chronic disease;; b. 08/2014: L Lower Extr - no acute or chronic thrombosis or thrombophlebitis. Deep venous insufficiency   Lower Extremity  Venous Dopplers  05/2010   Chronic left popliteal venous thrombosis and acute small saphenous vein thrombosis in the left.   TRANSTHORACIC ECHOCARDIOGRAM  10/2016   Normal LV size and function (likely vigorous). EF 65-70% with normal wall motion. GR1 DD.   TRANSTHORACIC ECHOCARDIOGRAM  07/05/2008   EF =>55%; MILD MITRAL REGURG. Mild aortic valve calcification    The patient has a family history of  shoulder Social History   Socioeconomic History   Marital status: Married    Spouse name: Not on file   Number of children: 2   Years of education: College    Highest education level: Not on file  Occupational History   Occupation: Retired  Tobacco Use   Smoking status: Former   Smokeless tobacco: Never  Substance and Sexual Activity   Alcohol use: Yes    Comment: 2-3 glasses    Drug use: No   Sexual activity: Not on file  Other Topics Concern   Not on file  Social History Narrative   SH: Live alone   - 2 children, 3 GC.     Usually exercises ~3 x week with long walks.    Quit smoking in his 64s.     2 glasses of wine/day   Caffeine use: 1 cup coffee every morning   Right handed   Social Determinants of Health   Financial Resource Strain: Not on file  Food Insecurity: Not on file  Transportation Needs: Not on file  Physical Activity: Not on file  Stress: Not on file  Social Connections: Not on file  Intimate Partner Violence: Not on file    Allergies  Allergen Reactions   Ace Inhibitors Cough    Allergies as of 11/04/2023       Reactions   Ace Inhibitors Cough        Medication List        Accurate as of November 04, 2023  2:05 PM. If you have any questions, ask your nurse or doctor.          aspirin EC 325 MG tablet Take 325 mg by mouth daily.   atorvastatin 20 MG tablet Commonly known as: LIPITOR Take 1 tablet (20 mg total) by mouth daily. <PLEASE MAKE APPOINTMENT FOR REFILLS>   Fish Oil 1000 MG Caps Take by mouth. TAKE 2 CAPSULES A DAY   GLUCOSAMINE PO Take 2 tablets by mouth daily.   losartan-hydrochlorothiazide 100-25 MG tablet Commonly known as: HYZAAR Take 1 tablet by mouth daily.   multivitamin tablet Take 1 tablet by mouth daily.         Review of Systems:  Review of Systems  Constitutional:  Negative for appetite change, fatigue and fever.  HENT:  Negative for congestion and trouble swallowing.   Eyes:  Negative for visual disturbance.  Respiratory:  Negative for cough and shortness of breath.   Gastrointestinal:  Negative for abdominal pain, constipation and nausea.   Genitourinary:  Negative for dysuria and urgency.  Musculoskeletal:  Positive for gait problem.  Skin:  Negative for color change.  Neurological:  Negative for speech difficulty and headaches.       Memory deficits.   Psychiatric/Behavioral:  Positive for confusion. Negative for sleep disturbance.     Physical Exam: Vitals:   11/04/23 0932  BP: (!) 108/59  Pulse: 64  Resp: 19  Temp: 98.3 F (36.8 C)  SpO2: 99%  Weight: 182 lb (82.6 kg)  Height: 5\' 11"  (1.803 m)   Body mass index is  25.38 kg/m. Physical Exam Vitals and nursing note reviewed.  Constitutional:      Appearance: Normal appearance.  HENT:     Head: Normocephalic and atraumatic.     Nose: Nose normal.     Mouth/Throat:     Mouth: Mucous membranes are moist.  Eyes:     Extraocular Movements: Extraocular movements intact.     Conjunctiva/sclera: Conjunctivae normal.     Pupils: Pupils are equal, round, and reactive to light.  Cardiovascular:     Rate and Rhythm: Normal rate.     Heart sounds: Murmur heard.  Pulmonary:     Effort: Pulmonary effort is normal.     Breath sounds: No rales.  Abdominal:     General: Bowel sounds are normal.     Palpations: Abdomen is soft.     Tenderness: There is no abdominal tenderness.  Musculoskeletal:     Cervical back: Normal range of motion and neck supple.     Right lower leg: No edema.     Left lower leg: No edema.  Skin:    General: Skin is warm and dry.  Neurological:     General: No focal deficit present.     Mental Status: He is alert. Mental status is at baseline.     Gait: Gait abnormal.     Comments: Oriented to person, his room  Psychiatric:        Mood and Affect: Mood normal.        Behavior: Behavior normal.     Labs reviewed: Basic Metabolic Panel: Recent Labs    10/22/23 0000  NA 142  K 3.8  CL 102  CO2 30*  BUN 32*  CREATININE 1.0  CALCIUM 9.9   Liver Function Tests: Recent Labs    10/22/23 0000  AST 21  ALT 27  ALKPHOS 64   ALBUMIN 4.5   No results for input(s): "LIPASE", "AMYLASE" in the last 8760 hours. No results for input(s): "AMMONIA" in the last 8760 hours. CBC: Recent Labs    10/22/23 0000  WBC 8.6  HGB 17.3  HCT 52  PLT 186   Lipid Panel: Recent Labs    10/22/23 0000  CHOL 144  HDL 41  LDLCALC 86  TRIG 80   No results found for: "HGBA1C"  Procedures: No results found.  Assessment/Plan  Essential hypertension Blood pressure is controlled,  taking Losartan/hydrochlorothiazide, Bun/creat 32/0.97 10/22/23  Hyperlipidemia with target LDL less than 70 Atorvastatin, LDL 86 10/22/23  Memory difficulty functioning in [facility], 09/05/23 redirected back to his room.    Labs/tests ordered:  TSH, CBC/diff  Plan of care reviewed with the patient and charge nurse.   Time spend 40 minutes.

## 2023-11-04 NOTE — Assessment & Plan Note (Addendum)
functioning in [facility], 09/05/23 redirected back to his room. Obtain MMSE, TSH 11/04/23 MMSE 9/30

## 2023-11-05 LAB — CBC: RBC: 5.22 — AB (ref 3.87–5.11)

## 2023-11-05 LAB — CBC AND DIFFERENTIAL
HCT: 48 (ref 41–53)
Hemoglobin: 16.1 (ref 13.5–17.5)
Neutrophils Absolute: 5078
Platelets: 156 10*3/uL (ref 150–400)
WBC: 6.9

## 2023-11-26 ENCOUNTER — Encounter: Payer: Self-pay | Admitting: Nurse Practitioner

## 2023-11-26 ENCOUNTER — Non-Acute Institutional Stay (INDEPENDENT_AMBULATORY_CARE_PROVIDER_SITE_OTHER): Payer: Medicare Other | Admitting: Nurse Practitioner

## 2023-11-26 DIAGNOSIS — Z0000 Encounter for general adult medical examination without abnormal findings: Secondary | ICD-10-CM

## 2023-11-26 DIAGNOSIS — Z66 Do not resuscitate: Secondary | ICD-10-CM | POA: Diagnosis not present

## 2023-11-26 NOTE — Progress Notes (Signed)
Subjective:   Darryl Page is a 88 y.o. male who presents for Medicare Annual/Subsequent preventive examination.  Visit Complete: In person  Patient Medicare AWV questionnaire was completed by the patient on 11/26/23; I have confirmed that all information answered by patient is correct and no changes since this date.  Cardiac Risk Factors include: advanced age (>52men, >84 women);dyslipidemia;hypertension;male gender     Objective:    Today's Vitals   11/26/23 0905  BP: 128/66  Pulse: 68  Resp: 18  Temp: 97.7 F (36.5 C)  SpO2: 97%  Weight: 182 lb (82.6 kg)  Height: 5\' 11"  (1.803 m)   Body mass index is 25.38 kg/m.     11/26/2023    8:58 AM 11/04/2023    9:47 AM  Advanced Directives  Does Patient Have a Medical Advance Directive? Yes Yes  Type of Estate agent of [place];Out of facility DNR (pink MOST or yellow form);Living will Healthcare Power of [place];Out of facility DNR (pink MOST or yellow form);Living will  Does patient want to make changes to medical advance directive? No - Patient declined No - Patient declined  Copy of Healthcare Power of Attorney in Chart? Yes - validated most recent copy scanned in chart (See row information) Yes - validated most recent copy scanned in chart (See row information)  Pre-existing out of facility DNR order (yellow form or pink MOST form) Pink MOST/Yellow Form most recent copy in chart - Physician notified to receive inpatient order Pink MOST/Yellow Form most recent copy in chart - Physician notified to receive inpatient order    Current Medications (verified) Outpatient Encounter Medications as of 11/26/2023  Medication Sig   aspirin EC 325 MG tablet Take 81 mg by mouth daily.   atorvastatin (LIPITOR) 20 MG tablet Take 1 tablet (20 mg total) by mouth daily. <PLEASE MAKE APPOINTMENT FOR REFILLS>   Glucosamine HCl (GLUCOSAMINE PO) Take 2 tablets by mouth daily.   losartan-hydrochlorothiazide (HYZAAR)  100-25 MG per tablet Take 1 tablet by mouth daily.   Multiple Vitamin (MULTIVITAMIN) tablet Take 1 tablet by mouth daily.   Omega-3 Fatty Acids (FISH OIL) 1000 MG CAPS Take by mouth. TAKE 2 CAPSULES A DAY (Patient not taking: Reported on 11/04/2023)   No facility-administered encounter medications on file as of 11/26/2023.    Allergies (verified) Ace inhibitors   History: Past Medical History:  Diagnosis Date   Essential hypertension    History of DVT of lower extremity 2009   LEFT COMMON FEMORAL VEINS AND LEFT POSTERIOR TIBAL VEIS IN THE CALF;  stop taking  warfarin in 2010   Hyperlipidemia LDL goal <100    Memory difficulty 07/30/2017   Moderate aortic stenosis by prior echocardiography 10/2016   Progression from mild to moderate (2015 - 2017 Echo)   Past Surgical History:  Procedure Laterality Date   BILATERAL LOWER ATERIAL DOPPLER  10/23/2011   BILATERAL ABIs normal 1.2;   left lower venous  doppler  10/23/2011; 09/24/2014   a. L CFV - visble thrombus noted wthin the vessel without complete compressibilty,the complete common femoral vein demonstrate excellent flow throughout consistent with chronic disease; left popliteal vein -visble thrombus;demonstrares excellent flow troughout consistent with chronic disease;; b. 08/2014: L Lower Extr - no acute or chronic thrombosis or thrombophlebitis. Deep venous insufficiency   Lower Extremity Venous Dopplers  05/2010   Chronic left popliteal venous thrombosis and acute small saphenous vein thrombosis in the left.   TRANSTHORACIC ECHOCARDIOGRAM  10/2016   Normal LV size  and function (likely vigorous). EF 65-70% with normal wall motion. GR1 DD.   TRANSTHORACIC ECHOCARDIOGRAM  07/05/2008   EF =>55%; MILD MITRAL REGURG. Mild aortic valve calcification   History reviewed. No pertinent family history. Social History   Socioeconomic History   Marital status: Married    Spouse name: Not on file   Number of children: 2   Years of education:  College   Highest education level: Not on file  Occupational History   Occupation: Retired  Tobacco Use   Smoking status: Former   Smokeless tobacco: Never  Substance and Sexual Activity   Alcohol use: Yes    Comment: 2-3 glasses    Drug use: No   Sexual activity: Not on file  Other Topics Concern   Not on file  Social History Narrative   SH: Live alone   - 2 children, 3 GC.     Usually exercises ~3 x week with long walks.    Quit smoking in his 28s.     2 glasses of wine/day   Caffeine use: 1 cup coffee every morning   Right handed   Social Determinants of Health   Financial Resource Strain: Not on file  Food Insecurity: Not on file  Transportation Needs: Not on file  Physical Activity: Not on file  Stress: Not on file  Social Connections: Not on file    Tobacco Counseling Counseling given: Not Answered   Clinical Intake:  Pre-visit preparation completed: Yes  Pain : No/denies pain     BMI - recorded: 25.38 Nutritional Status: BMI 25 -29 Overweight Nutritional Risks: None Diabetes: No  How often do you need to have someone help you when you read instructions, pamphlets, or other written materials from your doctor or pharmacy?: 4 - Often What is the last grade level you completed in school?: college  Interpreter Needed?: No  Information entered by :: Judith Campillo Nedra Hai NP   Activities of Daily Living    11/26/2023    2:50 PM  In your present state of health, do you have any difficulty performing the following activities:  Hearing? 0  Vision? 0  Difficulty concentrating or making decisions? 1  Walking or climbing stairs? 1  Dressing or bathing? 1  Doing errands, shopping? 1  Preparing Food and eating ? N  Using the Toilet? N  In the past six months, have you accidently leaked urine? Y  Do you have problems with loss of bowel control? N  Managing your Medications? Y  Managing your Finances? Y  Housekeeping or managing your Housekeeping? Y     Patient Care Team: Kylani Wires X, NP as PCP - General (Internal Medicine)  Indicate any recent Medical Services you may have received from other than [facility] providers in the past year (date may be approximate).     Assessment:   This is a routine wellness examination for Darryl Page.  Hearing/Vision screen No results found.   Goals Addressed             This Visit's Progress    Maintain Mobility and Function       Evidence-based guidance:  Acknowledge and validate impact of pain, loss of strength and potential disfigurement (hand osteoarthritis) on mental health and daily life, such as social isolation, anxiety, depression, impaired sexual relationship and   injury from falls.  Anticipate referral to physical or occupational therapy for assessment, therapeutic exercise and recommendation for adaptive equipment or assistive devices; encourage participation.  Assess impact on ability to  perform activities of daily living, as well as engage in sports and leisure events or requirements of work or school.  Provide anticipatory guidance and reassurance about the benefit of exercise to maintain function; acknowledge and normalize fear that exercise may worsen symptoms.  Encourage regular exercise, at least 10 minutes at a time for 45 minutes per week; consider yoga, water exercise and proprioceptive exercises; encourage use of wearable activity tracker to increase motivation and adherence.  Encourage maintenance or resumption of daily activities, including employment, as pain allows and with minimal exposure to trauma.  Assist patient to advocate for adaptations to the work environment.  Consider level of pain and function, gender, age, lifestyle, patient preference, quality of life, readiness and ?ocapacity to benefit? when recommending patients for orthopaedic surgery consultation.  Explore strategies, such as changes to medication regimen or activity that enables patient to anticipate and  manage flare-ups that increase deconditioning and disability.  Explore patient preferences; encourage exposure to a broader range of activities that have been avoided for fear of experiencing pain.  Identify barriers to participation in therapy or exercise, such as pain with activity, anticipated or imagined pain.  Monitor postoperative joint replacement or any preexisting joint replacement for ongoing pain and loss of function; provide social support and encouragement throughout recovery.   Notes:        Depression Screen     No data to display          Fall Risk     No data to display          MEDICARE RISK AT HOME: Medicare Risk at Home Any stairs in or around the home?: Yes If so, are there any without handrails?: No Home free of loose throw rugs in walkways, pet beds, electrical cords, etc?: No Adequate lighting in your home to reduce risk of falls?: Yes Life alert?: No Use of a cane, walker or w/c?: Yes Grab bars in the bathroom?: Yes Shower chair or bench in shower?: Yes Elevated toilet seat or a handicapped toilet?: Yes  TIMED UP AND GO:  Was the test performed?  Yes  Length of time to ambulate 10 feet: 8 sec Gait slow and steady with assistive device    Cognitive Function:    07/30/2017    9:18 AM  MMSE - Mini Mental State Exam  Orientation to time 4  Orientation to Place 5  Registration 3  Attention/ Calculation 1  Recall 0  Language- name 2 objects 2  Language- repeat 1  Language- follow 3 step command 2  Language- read & follow direction 1  Write a sentence 1  Copy design 0  Total score 20        Immunizations Immunization History  Administered Date(s) Administered   Influenza, High Dose Seasonal PF 10/02/2023   Moderna Covid-19 Vaccine Bivalent Booster 87yrs & up 10/16/2023   PFIZER(Purple Top)SARS-COV-2 Vaccination 02/13/2020, 03/07/2020   PNEUMOCOCCAL CONJUGATE-20 06/10/2023   Tdap 06/10/2023    TDAP status: Up to date  Flu  Vaccine status: Up to date  Pneumococcal vaccine status: Up to date  Covid-19 vaccine status: Completed vaccines  Qualifies for Shingles Vaccine? Yes   Zostavax completed Yes   Shingrix Completed?: No.    Education has been provided regarding the importance of this vaccine. Patient has been advised to call insurance company to determine out of pocket expense if they have not yet received this vaccine. Advised may also receive vaccine at local pharmacy or Health Dept. Verbalized acceptance  and understanding.  Screening Tests Health Maintenance  Topic Date Due   Zoster Vaccines- Shingrix (1 of 2) Never done   COVID-19 Vaccine (4 - 2023-24 season) 12/11/2023   Medicare Annual Wellness (AWV)  11/25/2024   DTaP/Tdap/Td (2 - Td or Tdap) 06/09/2033   Pneumonia Vaccine 30+ Years old  Completed   INFLUENZA VACCINE  Completed   HPV VACCINES  Aged Out    Health Maintenance  Health Maintenance Due  Topic Date Due   Zoster Vaccines- Shingrix (1 of 2) Never done    Colorectal cancer screening: No longer required.   Lung Cancer Screening: (Low Dose CT Chest recommended if Age 62-80 years, 20 pack-year currently smoking OR have quit w/in 15years.) does not qualify.   Lung Cancer Screening Referral: NA  Additional Screening:  Hepatitis C Screening: does not qualify; Completed   Vision Screening: Recommended annual ophthalmology exams for early detection of glaucoma and other disorders of the eye. Is the patient up to date with their annual eye exam?  No  Who is the provider or what is the name of the office in which the patient attends annual eye exams? [facility] will provide  If pt is not established with a provider, would they like to be referred to a provider to establish care? No .   Dental Screening: Recommended annual dental exams for proper oral hygiene  Diabetic Foot Exam: NA  Community Resource Referral / Chronic Care Management: CRR required this visit?  No   CCM required this  visit?  No     Plan:     I have personally reviewed and noted the following in the patient's chart:   Medical and social history Use of alcohol, tobacco or illicit drugs  Current medications and supplements including opioid prescriptions. Patient is not currently taking opioid prescriptions. Functional ability and status Nutritional status Physical activity Advanced directives List of other physicians Hospitalizations, surgeries, and ER visits in previous 12 months Vitals Screenings to include cognitive, depression, and falls Referrals and appointments  In addition, I have reviewed and discussed with patient certain preventive protocols, quality metrics, and best practice recommendations. A written personalized care plan for preventive services as well as general preventive health recommendations were provided to patient.     Darryl Page X Darryl Goelz, NP   11/26/2023   After Visit Summary: (In Person-Declined) Patient declined AVS at this time.

## 2024-02-14 ENCOUNTER — Non-Acute Institutional Stay: Payer: Medicare Other | Admitting: Sports Medicine

## 2024-02-14 ENCOUNTER — Encounter: Payer: Self-pay | Admitting: Sports Medicine

## 2024-02-14 DIAGNOSIS — E782 Mixed hyperlipidemia: Secondary | ICD-10-CM

## 2024-02-14 DIAGNOSIS — I10 Essential (primary) hypertension: Secondary | ICD-10-CM

## 2024-02-14 DIAGNOSIS — F0390 Unspecified dementia without behavioral disturbance: Secondary | ICD-10-CM

## 2024-02-14 NOTE — Progress Notes (Unsigned)
 Provider:  Dr. Venita Sheffield Location:  [facility] Place of Service:   Assisted living   PCP: Mast, Man X, NP Patient Care Team: Mast, Man X, NP as PCP - General (Internal Medicine)  Extended Emergency Contact Information Primary Emergency Contact: Blenda Peals [place] Home Phone: [phone] Mobile Phone: [phone] Relation: Daughter  Goals of Care: Advanced Directive information    11/26/2023    8:58 AM  Advanced Directives  Does Patient Have a Medical Advance Directive? Yes  Type of Estate agent of [place];Out of facility DNR (pink MOST or yellow form);Living will  Does patient want to make changes to medical advance directive? No - Patient declined  Copy of Healthcare Power of Attorney in Chart? Yes - validated most recent copy scanned in chart (See row information)  Pre-existing out of facility DNR order (yellow form or pink MOST form) Pink MOST/Yellow Form most recent copy in chart - Physician notified to receive inpatient order        89 yr old M with h/o Dementia, HLD, HTN, is evaluated for chronic disease management. Pt seen and examined in his room, he is watching TV Appears pleasant and not in distress He has no acute concerns Pt ambulates with rolator walker     Past Medical History:  Diagnosis Date   Essential hypertension    History of DVT of lower extremity 2009   LEFT COMMON FEMORAL VEINS AND LEFT POSTERIOR TIBAL VEIS IN THE CALF;  stop taking  warfarin in 2010   Hyperlipidemia LDL goal <100    Memory difficulty 07/30/2017   Moderate aortic stenosis by prior echocardiography 10/2016   Progression from mild to moderate (2015 - 2017 Echo)   Past Surgical History:  Procedure Laterality Date   BILATERAL LOWER ATERIAL DOPPLER  10/23/2011   BILATERAL ABIs normal 1.2;   left lower venous  doppler  10/23/2011; 09/24/2014   a. L CFV - visble thrombus noted wthin the vessel without complete  compressibilty,the complete common femoral vein demonstrate excellent flow throughout consistent with chronic disease; left popliteal vein -visble thrombus;demonstrares excellent flow troughout consistent with chronic disease;; b. 08/2014: L Lower Extr - no acute or chronic thrombosis or thrombophlebitis. Deep venous insufficiency   Lower Extremity Venous Dopplers  05/2010   Chronic left popliteal venous thrombosis and acute small saphenous vein thrombosis in the left.   TRANSTHORACIC ECHOCARDIOGRAM  10/2016   Normal LV size and function (likely vigorous). EF 65-70% with normal wall motion. GR1 DD.   TRANSTHORACIC ECHOCARDIOGRAM  07/05/2008   EF =>55%; MILD MITRAL REGURG. Mild aortic valve calcification    reports that he has quit smoking. He has never used smokeless tobacco. He reports current alcohol use. He reports that he does not use drugs. Social History   Socioeconomic History   Marital status: Married    Spouse name: Not on file   Number of children: 2   Years of education: College   Highest education level: Not on file  Occupational History   Occupation: Retired  Tobacco Use   Smoking status: Former   Smokeless tobacco: Never  Substance and Sexual Activity   Alcohol use: Yes    Comment: 2-3 glasses    Drug use: No   Sexual activity: Not on file  Other Topics Concern   Not on file  Social History Narrative   SH: Live alone   - 2 children, 3 GC.     Usually exercises ~3 x week with long  walks.    Quit smoking in his 17s.     2 glasses of wine/day   Caffeine use: 1 cup coffee every morning   Right handed   Social Drivers of Corporate investment banker Strain: Not on file  Food Insecurity: Not on file  Transportation Needs: Not on file  Physical Activity: Not on file  Stress: Not on file  Social Connections: Not on file  Intimate Partner Violence: Not on file    Functional Status Survey:    History reviewed. No pertinent family history.  Health Maintenance   Topic Date Due   Zoster Vaccines- Shingrix (1 of 2) Never done   COVID-19 Vaccine (4 - 2024-25 season) 12/11/2023   Medicare Annual Wellness (AWV)  11/25/2024   DTaP/Tdap/Td (2 - Td or Tdap) 06/09/2033   Pneumonia Vaccine 21+ Years old  Completed   INFLUENZA VACCINE  Completed   HPV VACCINES  Aged Out    Allergies  Allergen Reactions   Ace Inhibitors Cough    Outpatient Encounter Medications as of 02/14/2024  Medication Sig   aspirin EC 325 MG tablet Take 81 mg by mouth daily.   atorvastatin (LIPITOR) 20 MG tablet Take 1 tablet (20 mg total) by mouth daily. <PLEASE MAKE APPOINTMENT FOR REFILLS>   Glucosamine HCl (GLUCOSAMINE PO) Take 2 tablets by mouth daily.   losartan-hydrochlorothiazide (HYZAAR) 100-25 MG per tablet Take 1 tablet by mouth daily.   Multiple Vitamin (MULTIVITAMIN) tablet Take 1 tablet by mouth daily.   Omega-3 Fatty Acids (FISH OIL) 1000 MG CAPS Take by mouth. TAKE 2 CAPSULES A DAY (Patient not taking: Reported on 11/04/2023)   No facility-administered encounter medications on file as of 02/14/2024.    Review of Systems Negative unless indicated in HPI.  There were no vitals filed for this visit. There is no height or weight on file to calculate BMI. BP Readings from Last 3 Encounters:  11/26/23 128/66  11/04/23 (!) 108/59  08/13/23 137/65   Wt Readings from Last 3 Encounters:  11/26/23 182 lb (82.6 kg)  11/04/23 182 lb (82.6 kg)  06/03/20 188 lb (85.3 kg)   Physical Exam  Labs reviewed: Basic Metabolic Panel: Recent Labs    10/22/23 0000  NA 142  K 3.8  CL 102  CO2 30*  BUN 32*  CREATININE 1.0  CALCIUM 9.9   Liver Function Tests: Recent Labs    10/22/23 0000  AST 21  ALT 27  ALKPHOS 64  ALBUMIN 4.5   No results for input(s): "LIPASE", "AMYLASE" in the last 8760 hours. No results for input(s): "AMMONIA" in the last 8760 hours. CBC: Recent Labs    10/22/23 0000 11/05/23 0000  WBC 8.6 6.9  NEUTROABS  --  5,078.00  HGB 17.3 16.1   HCT 52 48  PLT 186 156   Cardiac Enzymes: No results for input(s): "CKTOTAL", "CKMB", "CKMBINDEX", "TROPONINI" in the last 8760 hours. BNP: Invalid input(s): "POCBNP" No results found for: "HGBA1C" Lab Results  Component Value Date   TSH 2.854 ***Test methodology is 3rd generation TSH*** 06/10/2008   No results found for: "VITAMINB12" No results found for: "FOLATE" No results found for: "IRON", "TIBC", "FERRITIN"  Imaging and Procedures obtained prior to SNF admission: CT Head Wo Contrast Result Date: 07/26/2018 CLINICAL DATA:  Fall from stool while drinking at a bar striking head. Altered mental status. Altered level of consciousness (LOC), unexplained; Neck pain, initial exam EXAM: CT HEAD WITHOUT CONTRAST CT CERVICAL SPINE WITHOUT CONTRAST TECHNIQUE: Multidetector CT  imaging of the head and cervical spine was performed following the standard protocol without intravenous contrast. Multiplanar CT image reconstructions of the cervical spine were also generated. COMPARISON:  Head CT 06/07/2017 FINDINGS: CT HEAD FINDINGS Brain: Mild atrophy and chronic small vessel ischemia, stable and unchanged from prior exam. No intracranial hemorrhage, mass effect, or midline shift. No hydrocephalus. The basilar cisterns are patent. No evidence of territorial infarct or acute ischemia. No extra-axial or intracranial fluid collection. Vascular: No hyperdense vessel or unexpected calcification. Skull: No fracture or focal lesion. Sinuses/Orbits: Paranasal sinuses and mastoid air cells are clear. The visualized orbits are unremarkable. Other: None. CT CERVICAL SPINE FINDINGS Alignment: No traumatic subluxation. Straightening of normal lordosis. Minimal anterolisthesis of C4 on C5 and C5 on C6 is likely degenerative. Skull base and vertebrae: No acute fracture. Vertebral body heights are maintained. The dens and skull base are intact. Soft tissues and spinal canal: No prevertebral fluid or swelling. No visible  canal hematoma. Disc levels: Diffuse disc space narrowing and endplate spurring throughout. Posterior disc osteophyte complex at C5-C6 and C6-C7 causing mild mass-effect on the spinal canal. Moderate multilevel facet arthropathy. Upper chest: Carotid calcifications with medial course of the right carotid artery. Calcified granuloma at the right lung apex. Other: None. IMPRESSION: 1.  No acute intracranial abnormality.  No skull fracture. 2. Multilevel degenerative change throughout the cervical spine without acute fracture or traumatic subluxation. Electronically Signed   By: Rubye Oaks M.D.   On: 07/26/2018 21:56   CT Cervical Spine Wo Contrast Result Date: 07/26/2018 CLINICAL DATA:  Fall from stool while drinking at a bar striking head. Altered mental status. Altered level of consciousness (LOC), unexplained; Neck pain, initial exam EXAM: CT HEAD WITHOUT CONTRAST CT CERVICAL SPINE WITHOUT CONTRAST TECHNIQUE: Multidetector CT imaging of the head and cervical spine was performed following the standard protocol without intravenous contrast. Multiplanar CT image reconstructions of the cervical spine were also generated. COMPARISON:  Head CT 06/07/2017 FINDINGS: CT HEAD FINDINGS Brain: Mild atrophy and chronic small vessel ischemia, stable and unchanged from prior exam. No intracranial hemorrhage, mass effect, or midline shift. No hydrocephalus. The basilar cisterns are patent. No evidence of territorial infarct or acute ischemia. No extra-axial or intracranial fluid collection. Vascular: No hyperdense vessel or unexpected calcification. Skull: No fracture or focal lesion. Sinuses/Orbits: Paranasal sinuses and mastoid air cells are clear. The visualized orbits are unremarkable. Other: None. CT CERVICAL SPINE FINDINGS Alignment: No traumatic subluxation. Straightening of normal lordosis. Minimal anterolisthesis of C4 on C5 and C5 on C6 is likely degenerative. Skull base and vertebrae: No acute fracture.  Vertebral body heights are maintained. The dens and skull base are intact. Soft tissues and spinal canal: No prevertebral fluid or swelling. No visible canal hematoma. Disc levels: Diffuse disc space narrowing and endplate spurring throughout. Posterior disc osteophyte complex at C5-C6 and C6-C7 causing mild mass-effect on the spinal canal. Moderate multilevel facet arthropathy. Upper chest: Carotid calcifications with medial course of the right carotid artery. Calcified granuloma at the right lung apex. Other: None. IMPRESSION: 1.  No acute intracranial abnormality.  No skull fracture. 2. Multilevel degenerative change throughout the cervical spine without acute fracture or traumatic subluxation. Electronically Signed   By: Rubye Oaks M.D.   On: 07/26/2018 21:56   DG Chest Port 1 View Result Date: 07/26/2018 CLINICAL DATA:  Possible syncope. Fall from stool in a bar striking head. EXAM: PORTABLE CHEST 1 VIEW COMPARISON:  Radiographs 09/24/2008 FINDINGS: Low lung volumes with bibasilar atelectasis.  Mild cardiomegaly. Atherosclerosis of the aorta. Bronchovascular crowding without pulmonary edema. No pneumothorax or large pleural effusion. No acute osseous abnormalities are seen. IMPRESSION: Hypoventilatory chest with bibasilar atelectasis. Cardiomegaly likely accentuated by technique. Electronically Signed   By: Rubye Oaks M.D.   On: 07/26/2018 21:37    Assessment and Plan               Family/ staff Communication:   Labs/tests ordered:  Venita Sheffield

## 2024-04-27 ENCOUNTER — Encounter: Payer: Self-pay | Admitting: Nurse Practitioner

## 2024-04-27 ENCOUNTER — Non-Acute Institutional Stay: Payer: Self-pay | Admitting: Nurse Practitioner

## 2024-04-27 DIAGNOSIS — E785 Hyperlipidemia, unspecified: Secondary | ICD-10-CM | POA: Diagnosis not present

## 2024-04-27 DIAGNOSIS — D582 Other hemoglobinopathies: Secondary | ICD-10-CM

## 2024-04-27 DIAGNOSIS — R413 Other amnesia: Secondary | ICD-10-CM | POA: Diagnosis not present

## 2024-04-27 DIAGNOSIS — I10 Essential (primary) hypertension: Secondary | ICD-10-CM

## 2024-04-27 NOTE — Assessment & Plan Note (Signed)
 Bp runs low, decrease Losartan 75/100mg , continue hydrochlorothiazide, Bun/creat 32/0.97 10/22/23 Bp daily.  BMP one week

## 2024-04-27 NOTE — Assessment & Plan Note (Signed)
 taking Atorvastatin , LDL 86 10/22/23

## 2024-04-27 NOTE — Assessment & Plan Note (Signed)
 Hgb 17.3 10/22/23<<16.1 11/05/23

## 2024-04-27 NOTE — Assessment & Plan Note (Signed)
 functioning in [facility], 11/04/23 MMSE 9/30

## 2024-04-27 NOTE — Progress Notes (Unsigned)
 Location:   [facility] Room Number: [id number] Place of Service:  ALF (13) Provider: Abner Hoffman Angelise Petrich NP  Cherokee Clowers X, NP  Patient Care Team: Dhrithi Riche X, NP as PCP - General (Internal Medicine)  Extended Emergency Contact Information Primary Emergency Contact: Brandus,Lynn  [place] Home Phone: [phone] Mobile Phone: [phone] Relation: Daughter  Code Status:  DNR Goals of care: Advanced Directive information    04/27/2024    3:45 PM  Advanced Directives  Does Patient Have a Medical Advance Directive? No  Would patient like information on creating a medical advance directive? No - Patient declined     Chief Complaint  Patient presents with   Medicare Wellness    Medicare Annual Wellness Visit    HPI:  Pt is a 89 y.o. Page seen today for medical management of chronic diseases.    Dementia, functioning in [facility], 11/04/23 MMSE 9/30             HLD, taking Atorvastatin , LDL 86 10/22/23             HTN, taking Losartan/hydrochlorothiazide, Bun/creat 32/0.97 10/22/23             Elevated Hgb 17.3 10/22/23<<16.1 plt 156 11/05/23   Past Medical History:  Diagnosis Date   Essential hypertension    History of DVT of lower extremity 2009   LEFT COMMON FEMORAL VEINS AND LEFT POSTERIOR TIBAL VEIS IN THE CALF;  stop taking  warfarin in 2010   Hyperlipidemia LDL goal <100    Memory difficulty 07/30/2017   Moderate aortic stenosis by prior echocardiography 10/2016   Progression from mild to moderate (2015 - 2017 Echo)   Past Surgical History:  Procedure Laterality Date   BILATERAL LOWER ATERIAL DOPPLER  10/23/2011   BILATERAL ABIs normal 1.2;   left lower venous  doppler  10/23/2011; 09/24/2014   a. L CFV - visble thrombus noted wthin the vessel without complete compressibilty,the complete common femoral vein demonstrate excellent flow throughout consistent with chronic disease; left popliteal vein -visble thrombus;demonstrares excellent flow troughout  consistent with chronic disease;; b. 08/2014: L Lower Extr - no acute or chronic thrombosis or thrombophlebitis. Deep venous insufficiency   Lower Extremity Venous Dopplers  05/2010   Chronic left popliteal venous thrombosis and acute small saphenous vein thrombosis in the left.   TRANSTHORACIC ECHOCARDIOGRAM  10/2016   Normal LV size and function (likely vigorous). EF 65-Darryl% with normal wall motion. GR1 DD.   TRANSTHORACIC ECHOCARDIOGRAM  07/05/2008   EF =>55%; MILD MITRAL REGURG. Mild aortic valve calcification    Allergies  Allergen Reactions   Ace Inhibitors Cough    Allergies as of 04/27/2024       Reactions   Ace Inhibitors Cough        Medication List        Accurate as of April 27, 2024 11:59 PM. If you have any questions, ask your nurse or doctor.          aspirin EC 325 MG tablet Take 81 mg by mouth daily.   atorvastatin  20 MG tablet Commonly known as: LIPITOR Take 1 tablet (20 mg total) by mouth daily. <PLEASE MAKE APPOINTMENT FOR REFILLS>   Fish Oil 1000 MG Caps Take by mouth. TAKE 2 CAPSULES A DAY   GLUCOSAMINE PO Take 2 tablets by mouth daily.   losartan-hydrochlorothiazide 100-25 MG tablet Commonly known as: HYZAAR Take 1 tablet by mouth daily.   MiraLax 17 GM/SCOOP powder Generic drug:  polyethylene glycol powder Take 17 g by mouth as needed for moderate constipation.   multivitamin tablet Take 1 tablet by mouth daily.        Review of Systems  Constitutional:  Negative for appetite change, fatigue and fever.  HENT:  Negative for congestion and trouble swallowing.   Eyes:  Negative for visual disturbance.  Respiratory:  Negative for cough and shortness of breath.   Cardiovascular:  Positive for leg swelling.  Gastrointestinal:  Negative for abdominal pain and constipation.  Genitourinary:  Negative for dysuria and urgency.  Musculoskeletal:  Positive for gait problem.  Skin:  Negative for color change.  Neurological:  Negative for  speech difficulty and headaches.       Memory deficits.   Psychiatric/Behavioral:  Positive for confusion. Negative for sleep disturbance.     Immunization History  Administered Date(s) Administered   Influenza, High Dose Seasonal PF 10/02/2023   Moderna Covid-19 Vaccine Bivalent Booster 47yrs & up 10/16/2023   PFIZER(Purple Top)SARS-COV-2 Vaccination 02/13/2020, 03/07/2020   PNEUMOCOCCAL CONJUGATE-20 06/10/2023   Tdap 06/10/2023   Zoster Recombinant(Shingrix) 11/29/2023, 01/27/2024   Pertinent  Health Maintenance Due  Topic Date Due   INFLUENZA VACCINE  07/31/2024       No data to display         Functional Status Survey:    Vitals:   04/27/24 1534 04/28/24 1041  BP: (!) 114/57 (!) 114/57  Pulse: 62   Resp: 18   Temp: (!) 97.5 F (36.4 C)   SpO2: 98%   Weight: 171 lb 1.6 oz (77.6 kg)   Height: 5\' 8"  (1.727 m)    Body mass index is 26.02 kg/m. Physical Exam Vitals and nursing note reviewed.  Constitutional:      Appearance: Normal appearance.  HENT:     Head: Normocephalic and atraumatic.     Nose: Nose normal.     Mouth/Throat:     Mouth: Mucous membranes are moist.  Eyes:     Extraocular Movements: Extraocular movements intact.     Conjunctiva/sclera: Conjunctivae normal.     Pupils: Pupils are equal, round, and reactive to light.  Cardiovascular:     Rate and Rhythm: Normal rate.     Heart sounds: Murmur heard.  Pulmonary:     Effort: Pulmonary effort is normal.     Breath sounds: No rales.  Abdominal:     General: Bowel sounds are normal.     Palpations: Abdomen is soft.     Tenderness: There is no abdominal tenderness.  Musculoskeletal:     Cervical back: Normal range of motion and neck supple.     Right lower leg: Edema present.     Left lower leg: Edema present.     Comments: Trace edema BLE  Skin:    General: Skin is warm and dry.  Neurological:     General: No focal deficit present.     Mental Status: He is alert. Mental status is at  baseline.     Gait: Gait abnormal.     Comments: Oriented to person, his room  Psychiatric:        Mood and Affect: Mood normal.        Behavior: Behavior normal.     Labs reviewed: Recent Labs    10/22/23 0000  NA 142  K 3.8  CL 102  CO2 30*  BUN 32*  CREATININE 1.0  CALCIUM  9.9   Recent Labs    10/22/23 0000  AST 21  ALT  27  ALKPHOS 64  ALBUMIN 4.5   Recent Labs    10/22/23 0000 11/05/23 0000  WBC 8.6 6.9  NEUTROABS  --  5,078.00  HGB 17.3 16.1  HCT 52 48  PLT 186 156    No results found for: "HGBA1C" Lab Results  Component Value Date   CHOL 144 10/22/2023   HDL 41 10/22/2023   LDLCALC 86 10/22/2023   TRIG 80 10/22/2023   CHOLHDL 3.5 11/02/2014    Significant Diagnostic Results in last 30 days:  No results found.  Assessment/Plan  Essential hypertension Bp runs low, decrease Losartan 75/100mg , continue hydrochlorothiazide, Bun/creat 32/0.97 10/22/23 Bp daily.  BMP one week  Elevated hemoglobin (HCC) Hgb 17.3 10/22/23<<16.1 11/05/23  Hyperlipidemia with target LDL less than Darryl  taking Atorvastatin , LDL 86 10/22/23  Memory difficulty functioning in [facility], 11/04/23 MMSE 9/30   Family/ staff Communication: plan of care reviewed with the patient and charge nurse.   Labs/tests ordered:  BMP one week.

## 2024-04-28 ENCOUNTER — Encounter: Payer: Self-pay | Admitting: Nurse Practitioner

## 2024-05-07 LAB — BASIC METABOLIC PANEL WITH GFR
BUN: 23 — AB (ref 4–21)
CO2: 33 — AB (ref 13–22)
Chloride: 103 (ref 99–108)
Creatinine: 0.9 (ref 0.6–1.3)
Glucose: 80
Potassium: 4.2 meq/L (ref 3.5–5.1)
Sodium: 142 (ref 137–147)

## 2024-05-07 LAB — COMPREHENSIVE METABOLIC PANEL WITH GFR: Calcium: 9.5 (ref 8.7–10.7)

## 2024-06-05 ENCOUNTER — Encounter: Payer: Self-pay | Admitting: Nurse Practitioner

## 2024-06-05 ENCOUNTER — Non-Acute Institutional Stay: Payer: Self-pay | Admitting: Nurse Practitioner

## 2024-06-05 DIAGNOSIS — D582 Other hemoglobinopathies: Secondary | ICD-10-CM | POA: Diagnosis not present

## 2024-06-05 DIAGNOSIS — I872 Venous insufficiency (chronic) (peripheral): Secondary | ICD-10-CM

## 2024-06-05 DIAGNOSIS — I10 Essential (primary) hypertension: Secondary | ICD-10-CM | POA: Diagnosis not present

## 2024-06-05 DIAGNOSIS — R413 Other amnesia: Secondary | ICD-10-CM | POA: Diagnosis not present

## 2024-06-05 DIAGNOSIS — E785 Hyperlipidemia, unspecified: Secondary | ICD-10-CM

## 2024-06-05 NOTE — Assessment & Plan Note (Signed)
 Hx of DVT LE, evaluated and treated by Cardiovascular specialist.

## 2024-06-05 NOTE — Assessment & Plan Note (Addendum)
 taking Atorvastatin , ASA, LDL 86 10/22/23

## 2024-06-05 NOTE — Assessment & Plan Note (Signed)
 Blood pressure is controlled, taking Losartan/hydrochlorothiazide, Bun/creat 23/0.85 05/07/24

## 2024-06-05 NOTE — Progress Notes (Signed)
 Location:   [facility] Room Number: 803 Place of Service:  ALF (13) Provider: Abner Hoffman Syrina Wake NP  Supreme Rybarczyk X, NP  Patient Care Team: Jamonta Goerner X, NP as PCP - General (Internal Medicine)  Extended Emergency Contact Information Primary Emergency Contact: Brandus,Lynn  [place] Home Phone: [phone] Mobile Phone: [phone] Relation: Daughter  Code Status: DNR Goals of care: Advanced Directive information    04/27/2024    3:45 PM  Advanced Directives  Does Patient Have a Medical Advance Directive? No  Would patient like information on creating a medical advance directive? No - Patient declined     Chief Complaint  Patient presents with   Acute Visit    Transferring to SNF [facility] for higher level of care    HPI:  Pt is a 89 y.o. male seen today for an acute visit for evaluation of chronic medical condition prior to admitting to SNF [facility] for higher level of care.    Dementia, progressing, needs more supervision and assistance for ADLs, 11/04/23 MMSE 9/30, TSH 2.54 11/05/23             HLD, taking Atorvastatin , ASA, LDL 86 10/22/23             HTN, taking Losartan/hydrochlorothiazide, Bun/creat 23/0.85 05/07/24             Elevated Hgb 17.3 10/22/23<<16.1 plt 156 11/05/23, evaluated by oncology.   Hx of DVT LE, evaluated and treated by Cardiovascular specialist.        Past Medical History:  Diagnosis Date   Essential hypertension    History of DVT of lower extremity 2009   LEFT COMMON FEMORAL VEINS AND LEFT POSTERIOR TIBAL VEIS IN THE CALF;  stop taking  warfarin in 2010   Hyperlipidemia LDL goal <100    Memory difficulty 07/30/2017   Moderate aortic stenosis by prior echocardiography 10/2016   Progression from mild to moderate (2015 - 2017 Echo)   Past Surgical History:  Procedure Laterality Date   BILATERAL LOWER ATERIAL DOPPLER  10/23/2011   BILATERAL ABIs normal 1.2;   left lower venous  doppler  10/23/2011; 09/24/2014   a. L CFV - visble  thrombus noted wthin the vessel without complete compressibilty,the complete common femoral vein demonstrate excellent flow throughout consistent with chronic disease; left popliteal vein -visble thrombus;demonstrares excellent flow troughout consistent with chronic disease;; b. 08/2014: L Lower Extr - no acute or chronic thrombosis or thrombophlebitis. Deep venous insufficiency   Lower Extremity Venous Dopplers  05/2010   Chronic left popliteal venous thrombosis and acute small saphenous vein thrombosis in the left.   TRANSTHORACIC ECHOCARDIOGRAM  10/2016   Normal LV size and function (likely vigorous). EF 65-70% with normal wall motion. GR1 DD.   TRANSTHORACIC ECHOCARDIOGRAM  07/05/2008   EF =>55%; MILD MITRAL REGURG. Mild aortic valve calcification    Allergies  Allergen Reactions   Ace Inhibitors Cough    Allergies as of 06/05/2024       Reactions   Ace Inhibitors Cough        Medication List        Accurate as of June 05, 2024  3:39 PM. If you have any questions, ask your nurse or doctor.          aspirin EC 325 MG tablet Take 81 mg by mouth daily.   atorvastatin  20 MG tablet Commonly known as: LIPITOR Take 1 tablet (20 mg total) by mouth daily. <PLEASE MAKE APPOINTMENT FOR REFILLS>  Fish Oil 1000 MG Caps Take by mouth. TAKE 2 CAPSULES A DAY   GLUCOSAMINE PO Take 2 tablets by mouth daily.   losartan-hydrochlorothiazide 100-25 MG tablet Commonly known as: HYZAAR Take 1 tablet by mouth daily.   MiraLax 17 GM/SCOOP powder Generic drug: polyethylene glycol powder Take 17 g by mouth as needed for moderate constipation.   multivitamin tablet Take 1 tablet by mouth daily.        Review of Systems  Constitutional:  Negative for appetite change, fatigue and fever.  HENT:  Negative for congestion and trouble swallowing.   Eyes:  Negative for visual disturbance.  Respiratory:  Negative for cough and shortness of breath.   Cardiovascular:  Positive for leg  swelling.  Gastrointestinal:  Negative for abdominal pain and constipation.  Genitourinary:  Negative for dysuria and urgency.  Musculoskeletal:  Positive for gait problem.  Skin:  Negative for color change.  Neurological:  Negative for speech difficulty and headaches.       Memory deficits.   Psychiatric/Behavioral:  Positive for confusion. Negative for sleep disturbance.     Immunization History  Administered Date(s) Administered   Influenza, High Dose Seasonal PF 10/02/2023   Moderna Covid-19 Vaccine Bivalent Booster 45yrs & up 10/16/2023   PFIZER(Purple Top)SARS-COV-2 Vaccination 02/13/2020, 03/07/2020   PNEUMOCOCCAL CONJUGATE-20 06/10/2023   Tdap 06/10/2023   Zoster Recombinant(Shingrix) 11/29/2023, 01/27/2024   Pertinent  Health Maintenance Due  Topic Date Due   INFLUENZA VACCINE  07/31/2024       No data to display         Functional Status Survey:    Vitals:   06/05/24 1520  BP: 111/62  Pulse: 65  Resp: 18  Temp: 97.9 F (36.6 C)  SpO2: 97%  Weight: 171 lb (77.6 kg)   Body mass index is 26 kg/m. Physical Exam Vitals and nursing note reviewed.  Constitutional:      Appearance: Normal appearance.  HENT:     Head: Normocephalic and atraumatic.     Nose: Nose normal.     Mouth/Throat:     Mouth: Mucous membranes are moist.  Eyes:     Extraocular Movements: Extraocular movements intact.     Conjunctiva/sclera: Conjunctivae normal.     Pupils: Pupils are equal, round, and reactive to light.  Cardiovascular:     Rate and Rhythm: Normal rate.     Heart sounds: Murmur heard.  Pulmonary:     Effort: Pulmonary effort is normal.     Breath sounds: No rales.  Abdominal:     General: Bowel sounds are normal.     Palpations: Abdomen is soft.     Tenderness: There is no abdominal tenderness.  Musculoskeletal:     Cervical back: Normal range of motion and neck supple.     Right lower leg: Edema present.     Left lower leg: Edema present.     Comments:  Trace edema BLE  Skin:    General: Skin is warm and dry.  Neurological:     General: No focal deficit present.     Mental Status: He is alert. Mental status is at baseline.     Gait: Gait abnormal.     Comments: Oriented to person, his room  Psychiatric:        Mood and Affect: Mood normal.        Behavior: Behavior normal.     Labs reviewed: Recent Labs    10/22/23 0000  NA 142  K 3.8  CL  102  CO2 30*  BUN 32*  CREATININE 1.0  CALCIUM  9.9   Recent Labs    10/22/23 0000  AST 21  ALT 27  ALKPHOS 64  ALBUMIN 4.5   Recent Labs    10/22/23 0000 11/05/23 0000  WBC 8.6 6.9  NEUTROABS  --  5,078.00  HGB 17.3 16.1  HCT 52 48  PLT 186 156    No results found for: "HGBA1C" Lab Results  Component Value Date   CHOL 144 10/22/2023   HDL 41 10/22/2023   LDLCALC 86 10/22/2023   TRIG 80 10/22/2023   CHOLHDL 3.5 11/02/2014    Significant Diagnostic Results in last 30 days:  No results found.  Assessment/Plan: Memory difficulty progressing, needs more supervision and assistance for ADLs, 11/04/23 MMSE 9/30, TSH 2.54 11/05/23 Admitting to Memory Care unit SNF for higher level of care  Hyperlipidemia with target LDL less than 70  taking Atorvastatin , ASA, LDL 86 10/22/23  Essential hypertension Blood pressure is controlled, taking Losartan/hydrochlorothiazide, Bun/creat 23/0.85 05/07/24  Elevated hemoglobin (HCC) Elevated Hgb 17.3 10/22/23<<16.1 plt 156 11/05/23  Deep Venous insufficiency of left lower extremity; without ulceration or inflammation Hx of DVT LE, evaluated and treated by Cardiovascular specialist.     Family/ staff Communication: plan of care reviewed with the patient and charge nurse.   Labs/tests ordered:  none

## 2024-06-05 NOTE — Assessment & Plan Note (Signed)
 progressing, needs more supervision and assistance for ADLs, 11/04/23 MMSE 9/30, TSH 2.54 11/05/23 Admitting to Memory Care unit SNF for higher level of care

## 2024-06-05 NOTE — Assessment & Plan Note (Signed)
 Elevated Hgb 17.3 10/22/23<<16.1 plt 156 11/05/23

## 2024-06-15 ENCOUNTER — Non-Acute Institutional Stay (SKILLED_NURSING_FACILITY): Payer: Self-pay | Admitting: Sports Medicine

## 2024-06-15 ENCOUNTER — Encounter: Payer: Self-pay | Admitting: Sports Medicine

## 2024-06-15 DIAGNOSIS — I10 Essential (primary) hypertension: Secondary | ICD-10-CM | POA: Diagnosis not present

## 2024-06-15 DIAGNOSIS — F0390 Unspecified dementia without behavioral disturbance: Secondary | ICD-10-CM

## 2024-06-15 DIAGNOSIS — E782 Mixed hyperlipidemia: Secondary | ICD-10-CM

## 2024-06-15 DIAGNOSIS — R634 Abnormal weight loss: Secondary | ICD-10-CM | POA: Diagnosis not present

## 2024-06-15 NOTE — Progress Notes (Unsigned)
 Provider:  Fredy Jericho  Location:   [facility] Room Number: 106-A Place of Service:  SNF (31)  PCP: Mast, Man X, NP Patient Care Team: Mast, Man X, NP as PCP - General (Internal Medicine)  Extended Emergency Contact Information Primary Emergency Contact: Brandus,Lynn  [place] Home Phone: [phone] Mobile Phone: [phone] Relation: Daughter  Code Status: DNR Goals of Care: Advanced Directive information    06/15/2024    9:30 AM  Advanced Directives  Does Patient Have a Medical Advance Directive? Yes  Type of Advance Directive Out of facility DNR (pink MOST or yellow form)  Does patient want to make changes to medical advance directive? No - Patient declined      Chief Complaint  Patient presents with   New Admit To SNF    HPI: Patient is a 89 y.o. Page seen today for admission to  Past Medical History:  Diagnosis Date   Essential hypertension    History of DVT of lower extremity 2009   LEFT COMMON FEMORAL VEINS AND LEFT POSTERIOR TIBAL VEIS IN THE CALF;  stop taking  warfarin in 2010   Hyperlipidemia LDL goal <100    Memory difficulty 07/30/2017   Moderate aortic stenosis by prior echocardiography 10/2016   Progression from mild to moderate (2015 - 2017 Echo)   Past Surgical History:  Procedure Laterality Date   BILATERAL LOWER ATERIAL DOPPLER  10/23/2011   BILATERAL ABIs normal 1.2;   left lower venous  doppler  10/23/2011; 09/Darryl/2015   a. L CFV - visble thrombus noted wthin the vessel without complete compressibilty,the complete common femoral vein demonstrate excellent flow throughout consistent with chronic disease; left popliteal vein -visble thrombus;demonstrares excellent flow troughout consistent with chronic disease;; b. 08/2014: L Lower Extr - no acute or chronic thrombosis or thrombophlebitis. Deep venous insufficiency   Lower Extremity Venous Dopplers  05/2010   Chronic left popliteal venous  thrombosis and acute small saphenous vein thrombosis in the left.   TRANSTHORACIC ECHOCARDIOGRAM  10/2016   Normal LV size and function (likely vigorous). EF 65-70% with normal wall motion. GR1 DD.   TRANSTHORACIC ECHOCARDIOGRAM  07/05/2008   EF =>55%; MILD MITRAL REGURG. Mild aortic valve calcification    reports that he has quit smoking. He has never used smokeless tobacco. He reports current alcohol use. He reports that he does not use drugs. Social History   Socioeconomic History   Marital status: Married    Spouse name: Not on file   Number of children: 2   Years of education: College   Highest education level: Not on file  Occupational History   Occupation: Retired  Tobacco Use   Smoking status: Former   Smokeless tobacco: Never  Substance and Sexual Activity   Alcohol use: Yes    Comment: 2-3 glasses    Drug use: No   Sexual activity: Not on file  Other Topics Concern   Not on file  Social History Narrative   SH: Live alone   - 2 children, 3 GC.     Usually exercises ~3 x week with long walks.    Quit smoking in his 61s.     2 glasses of wine/day   Caffeine use: 1 cup coffee every morning   Right handed   Social Drivers of Corporate investment banker Strain: Not on file  Food Insecurity: Not on file  Transportation Needs: Not on file  Physical Activity: Not on file  Stress: Not on  file  Social Connections: Not on file  Intimate Partner Violence: Not on file    Functional Status Survey:    History reviewed. No pertinent family history.  Health Maintenance  Topic Date Due   COVID-19 Vaccine (4 - 2024-Darryl season) 08/31/2024 (Originally 12/11/2023)   INFLUENZA VACCINE  07/31/2024   Medicare Annual Wellness (AWV)  11/25/2024   DTaP/Tdap/Td (2 - Td or Tdap) 06/09/2033   Pneumococcal Vaccine: 50+ Years  Completed   Zoster Vaccines- Shingrix  Completed   HPV VACCINES  Aged Out   Meningococcal B Vaccine  Aged Out    Allergies  Allergen Reactions   Ace  Inhibitors Cough    Allergies as of 06/15/2024       Reactions   Ace Inhibitors Cough        Medication List        Accurate as of June 15, 2024  9:52 AM. If you have any questions, ask your nurse or doctor.          aspirin 81 MG chewable tablet Chew 81 mg by mouth daily. What changed: Another medication with the same name was removed. Continue taking this medication, and follow the directions you see here. Changed by: Tye Gall   atorvastatin  20 MG tablet Commonly known as: LIPITOR Take 1 tablet (20 mg total) by mouth daily. <PLEASE MAKE APPOINTMENT FOR REFILLS>   Fish Oil 1000 MG Caps Take by mouth. TAKE 2 CAPSULES A DAY   GLUCOSAMINE PO Take 2 tablets by mouth daily.   hydrochlorothiazide 12.5 MG tablet Commonly known as: HYDRODIURIL Take 12.5 mg by mouth daily.   losartan Darryl MG tablet Commonly known as: COZAAR Take Darryl mg by mouth daily.   losartan 50 MG tablet Commonly known as: COZAAR Take 50 mg by mouth daily.   losartan-hydrochlorothiazide 100-Darryl MG tablet Commonly known as: HYZAAR Take 1 tablet by mouth daily.   MiraLax 17 GM/SCOOP powder Generic drug: polyethylene glycol powder Take 17 g by mouth as needed for moderate constipation.   multivitamin tablet Take 1 tablet by mouth daily.        Review of Systems  Vitals:   06/16/Darryl 0914  BP: 111/60  Pulse: 92  Resp: 16  Temp: (!) 97.3 F (36.3 C)  SpO2: 99%  Weight: 172 lb 14.4 oz (78.4 kg)  Height: 5' 8 (1.727 m)   Body mass index is 26.29 kg/m. Physical Exam  Labs reviewed: Basic Metabolic Panel: Recent Labs    10/22/23 0000 05/08/Darryl 0000  NA 142 142  K 3.8 4.2  CL 102 103  CO2 30* 33*  BUN 32* 23*  CREATININE 1.0 0.9  CALCIUM  9.9 9.5   Liver Function Tests: Recent Labs    10/22/23 0000  AST 21  ALT 27  ALKPHOS 64  ALBUMIN 4.5   No results for input(s): LIPASE, AMYLASE in the last 8760 hours. No results for input(s): AMMONIA in the last  8760 hours. CBC: Recent Labs    10/22/23 0000 11/05/23 0000  WBC 8.6 6.9  NEUTROABS  --  5,078.00  HGB 17.3 16.1  HCT 52 48  PLT 186 156   Cardiac Enzymes: No results for input(s): CKTOTAL, CKMB, CKMBINDEX, TROPONINI in the last 8760 hours. BNP: Invalid input(s): POCBNP No results found for: HGBA1C Lab Results  Component Value Date   TSH 2.854 ***Test methodology is 3rd generation TSH*** 06/10/2008   No results found for: VITAMINB12 No results found for: FOLATE No results found for: IRON, TIBC, FERRITIN  Imaging and Procedures obtained prior to SNF admission: CT Head Wo Contrast Result Date: 07/26/2018 CLINICAL DATA:  Fall from stool while drinking at a bar striking head. Altered mental status. Altered level of consciousness (LOC), unexplained; Neck pain, initial exam EXAM: CT HEAD WITHOUT CONTRAST CT CERVICAL SPINE WITHOUT CONTRAST TECHNIQUE: Multidetector CT imaging of the head and cervical spine was performed following the standard protocol without intravenous contrast. Multiplanar CT image reconstructions of the cervical spine were also generated. COMPARISON:  Head CT 06/07/2017 FINDINGS: CT HEAD FINDINGS Brain: Mild atrophy and chronic small vessel ischemia, stable and unchanged from prior exam. No intracranial hemorrhage, mass effect, or midline shift. No hydrocephalus. The basilar cisterns are patent. No evidence of territorial infarct or acute ischemia. No extra-axial or intracranial fluid collection. Vascular: No hyperdense vessel or unexpected calcification. Skull: No fracture or focal lesion. Sinuses/Orbits: Paranasal sinuses and mastoid air cells are clear. The visualized orbits are unremarkable. Other: None. CT CERVICAL SPINE FINDINGS Alignment: No traumatic subluxation. Straightening of normal lordosis. Minimal anterolisthesis of C4 on C5 and C5 on C6 is likely degenerative. Skull base and vertebrae: No acute fracture. Vertebral body heights are  maintained. The dens and skull base are intact. Soft tissues and spinal canal: No prevertebral fluid or swelling. No visible canal hematoma. Disc levels: Diffuse disc space narrowing and endplate spurring throughout. Posterior disc osteophyte complex at C5-C6 and C6-C7 causing mild mass-effect on the spinal canal. Moderate multilevel facet arthropathy. Upper chest: Carotid calcifications with medial course of the right carotid artery. Calcified granuloma at the right lung apex. Other: None. IMPRESSION: 1.  No acute intracranial abnormality.  No skull fracture. 2. Multilevel degenerative change throughout the cervical spine without acute fracture or traumatic subluxation. Electronically Signed   By: Adelle Agent M.D.   On: 07/26/2018 21:56   CT Cervical Spine Wo Contrast Result Date: 07/26/2018 CLINICAL DATA:  Fall from stool while drinking at a bar striking head. Altered mental status. Altered level of consciousness (LOC), unexplained; Neck pain, initial exam EXAM: CT HEAD WITHOUT CONTRAST CT CERVICAL SPINE WITHOUT CONTRAST TECHNIQUE: Multidetector CT imaging of the head and cervical spine was performed following the standard protocol without intravenous contrast. Multiplanar CT image reconstructions of the cervical spine were also generated. COMPARISON:  Head CT 06/07/2017 FINDINGS: CT HEAD FINDINGS Brain: Mild atrophy and chronic small vessel ischemia, stable and unchanged from prior exam. No intracranial hemorrhage, mass effect, or midline shift. No hydrocephalus. The basilar cisterns are patent. No evidence of territorial infarct or acute ischemia. No extra-axial or intracranial fluid collection. Vascular: No hyperdense vessel or unexpected calcification. Skull: No fracture or focal lesion. Sinuses/Orbits: Paranasal sinuses and mastoid air cells are clear. The visualized orbits are unremarkable. Other: None. CT CERVICAL SPINE FINDINGS Alignment: No traumatic subluxation. Straightening of normal lordosis.  Minimal anterolisthesis of C4 on C5 and C5 on C6 is likely degenerative. Skull base and vertebrae: No acute fracture. Vertebral body heights are maintained. The dens and skull base are intact. Soft tissues and spinal canal: No prevertebral fluid or swelling. No visible canal hematoma. Disc levels: Diffuse disc space narrowing and endplate spurring throughout. Posterior disc osteophyte complex at C5-C6 and C6-C7 causing mild mass-effect on the spinal canal. Moderate multilevel facet arthropathy. Upper chest: Carotid calcifications with medial course of the right carotid artery. Calcified granuloma at the right lung apex. Other: None. IMPRESSION: 1.  No acute intracranial abnormality.  No skull fracture. 2. Multilevel degenerative change throughout the cervical spine without acute fracture or traumatic  subluxation. Electronically Signed   By: Adelle Agent M.D.   On: 07/26/2018 21:56   DG Chest Port 1 View Result Date: 07/26/2018 CLINICAL DATA:  Possible syncope. Fall from stool in a bar striking head. EXAM: PORTABLE CHEST 1 VIEW COMPARISON:  Radiographs 09/Darryl/2009 FINDINGS: Low lung volumes with bibasilar atelectasis. Mild cardiomegaly. Atherosclerosis of the aorta. Bronchovascular crowding without pulmonary edema. No pneumothorax or large pleural effusion. No acute osseous abnormalities are seen. IMPRESSION: Hypoventilatory chest with bibasilar atelectasis. Cardiomegaly likely accentuated by technique. Electronically Signed   By: Adelle Agent M.D.   On: 07/26/2018 21:37    Assessment/Plan There are no diagnoses linked to this encounter.   Family/ staff Communication:   Labs/tests ordered:

## 2024-06-17 ENCOUNTER — Encounter: Payer: Self-pay | Admitting: Sports Medicine

## 2024-07-06 ENCOUNTER — Non-Acute Institutional Stay (SKILLED_NURSING_FACILITY): Payer: Self-pay | Admitting: Nurse Practitioner

## 2024-07-06 ENCOUNTER — Encounter: Payer: Self-pay | Admitting: Nurse Practitioner

## 2024-07-06 DIAGNOSIS — D582 Other hemoglobinopathies: Secondary | ICD-10-CM

## 2024-07-06 DIAGNOSIS — I10 Essential (primary) hypertension: Secondary | ICD-10-CM

## 2024-07-06 DIAGNOSIS — E785 Hyperlipidemia, unspecified: Secondary | ICD-10-CM

## 2024-07-06 DIAGNOSIS — I872 Venous insufficiency (chronic) (peripheral): Secondary | ICD-10-CM

## 2024-07-06 DIAGNOSIS — K5901 Slow transit constipation: Secondary | ICD-10-CM | POA: Insufficient documentation

## 2024-07-06 DIAGNOSIS — F0390 Unspecified dementia without behavioral disturbance: Secondary | ICD-10-CM

## 2024-07-06 NOTE — Assessment & Plan Note (Signed)
 Blood pressure is controlled, taking Losartan/hydrochlorothiazide, Bun/creat 23/0.85 05/07/24

## 2024-07-06 NOTE — Assessment & Plan Note (Signed)
 Hx of DVT LE, evaluated and treated by Cardiovascular specialist.

## 2024-07-06 NOTE — Progress Notes (Unsigned)
 Location:  [facility]  Nursing Home Room Number: 106-A Place of Service:  SNF (31) Provider:  Alissa Pharr X, NP   Celise Bazar X, NP  Patient Care Team: Virtie Bungert X, NP as PCP - General (Internal Medicine)  Extended Emergency Contact Information Primary Emergency Contact: Brandus,Lynn  [place] Home Phone: [phone] Mobile Phone: [phone] Relation: Daughter  Code Status:  DNR Goals of care: Advanced Directive information    07/06/2024   11:37 AM  Advanced Directives  Does Patient Have a Medical Advance Directive? Yes  Type of Advance Directive Out of facility DNR (pink MOST or yellow form)  Does patient want to make changes to medical advance directive? No - Patient declined     Chief Complaint  Patient presents with   Medical Management of Chronic Issues    Routine visit    HPI:  Pt is a 89 y.o. male seen today for medical management of chronic diseases.    Dementia, progressing, needs more supervision and assistance for ADLs, 11/04/23 MMSE 9/30, TSH 2.54 11/05/23             HLD, taking Atorvastatin , ASA, LDL 86 10/22/23             HTN, taking Losartan/hydrochlorothiazide, Bun/creat 23/0.85 05/07/24             Elevated Hgb 16.1, plt 156 11/05/23, evaluated by oncology.              Hx of DVT LE, evaluated and treated by Cardiovascular specialist.   Constipation, prn MiraLax   Past Medical History:  Diagnosis Date   Essential hypertension    History of DVT of lower extremity 2009   LEFT COMMON FEMORAL VEINS AND LEFT POSTERIOR TIBAL VEIS IN THE CALF;  stop taking  warfarin in 2010   Hyperlipidemia LDL goal <100    Memory difficulty 07/30/2017   Moderate aortic stenosis by prior echocardiography 10/2016   Progression from mild to moderate (2015 - 2017 Echo)   Past Surgical History:  Procedure Laterality Date   BILATERAL LOWER ATERIAL DOPPLER  10/23/2011   BILATERAL ABIs normal 1.2;   left lower venous  doppler  10/23/2011;  09/24/2014   a. L CFV - visble thrombus noted wthin the vessel without complete compressibilty,the complete common femoral vein demonstrate excellent flow throughout consistent with chronic disease; left popliteal vein -visble thrombus;demonstrares excellent flow troughout consistent with chronic disease;; b. 08/2014: L Lower Extr - no acute or chronic thrombosis or thrombophlebitis. Deep venous insufficiency   Lower Extremity Venous Dopplers  05/2010   Chronic left popliteal venous thrombosis and acute small saphenous vein thrombosis in the left.   TRANSTHORACIC ECHOCARDIOGRAM  10/2016   Normal LV size and function (likely vigorous). EF 65-70% with normal wall motion. GR1 DD.   TRANSTHORACIC ECHOCARDIOGRAM  07/05/2008   EF =>55%; MILD MITRAL REGURG. Mild aortic valve calcification    Allergies  Allergen Reactions   Ace Inhibitors Cough    Allergies as of 07/06/2024       Reactions   Ace Inhibitors Cough        Medication List        Accurate as of July 06, 2024 11:59 PM. If you have any questions, ask your nurse or doctor.          STOP taking these medications    Fish Oil 1000 MG Caps Stopped by: Anetria Harwick X Rayansh Herbst   losartan-hydrochlorothiazide 100-25 MG tablet Commonly known as: HYZAAR  Stopped by: Jazmina Muhlenkamp X Andjela Wickes       TAKE these medications    aspirin 81 MG chewable tablet Chew 81 mg by mouth daily.   atorvastatin  20 MG tablet Commonly known as: LIPITOR Take 1 tablet (20 mg total) by mouth daily. <PLEASE MAKE APPOINTMENT FOR REFILLS>   GLUCOSAMINE PO Take 2 tablets by mouth daily.   hydrochlorothiazide 12.5 MG tablet Commonly known as: HYDRODIURIL Take 12.5 mg by mouth daily.   losartan 25 MG tablet Commonly known as: COZAAR Take 25 mg by mouth daily.   losartan 50 MG tablet Commonly known as: COZAAR Take 50 mg by mouth daily.   MiraLax 17 GM/SCOOP powder Generic drug: polyethylene glycol powder Take 17 g by mouth as needed for moderate constipation.    multivitamin tablet Take 1 tablet by mouth daily.        Review of Systems  Constitutional:  Negative for appetite change, fatigue and fever.  HENT:  Negative for congestion and trouble swallowing.   Eyes:  Negative for visual disturbance.  Respiratory:  Negative for cough and shortness of breath.   Cardiovascular:  Positive for leg swelling.  Gastrointestinal:  Negative for abdominal pain and constipation.  Genitourinary:  Negative for dysuria and urgency.  Musculoskeletal:  Positive for gait problem.  Skin:  Negative for color change.  Neurological:  Negative for speech difficulty and headaches.       Memory deficits.   Psychiatric/Behavioral:  Positive for confusion. Negative for sleep disturbance.     Immunization History  Administered Date(s) Administered   Influenza, High Dose Seasonal PF 10/02/2023   Moderna Covid-19 Vaccine Bivalent Booster 43yrs & up 10/16/2023   PFIZER(Purple Top)SARS-COV-2 Vaccination 02/13/2020, 03/07/2020   PNEUMOCOCCAL CONJUGATE-20 06/10/2023   Tdap 06/10/2023   Zoster Recombinant(Shingrix) 11/29/2023, 01/27/2024   Pertinent  Health Maintenance Due  Topic Date Due   INFLUENZA VACCINE  07/31/2024       No data to display         Functional Status Survey:    Vitals:   07/06/24 1131  BP: 110/65  Pulse: 75  Resp: 16  Temp: (!) 97.3 F (36.3 C)  SpO2: 99%  Weight: 181 lb 6.4 oz (82.3 kg)   Body mass index is 27.58 kg/m. Physical Exam Vitals and nursing note reviewed.  Constitutional:      Appearance: Normal appearance.  HENT:     Head: Normocephalic and atraumatic.     Nose: Nose normal.     Mouth/Throat:     Mouth: Mucous membranes are moist.  Eyes:     Extraocular Movements: Extraocular movements intact.     Conjunctiva/sclera: Conjunctivae normal.     Pupils: Pupils are equal, round, and reactive to light.  Cardiovascular:     Rate and Rhythm: Normal rate.     Heart sounds: Murmur heard.  Pulmonary:     Effort:  Pulmonary effort is normal.     Breath sounds: No rales.  Abdominal:     General: Bowel sounds are normal.     Palpations: Abdomen is soft.     Tenderness: There is no abdominal tenderness.  Musculoskeletal:     Cervical back: Normal range of motion and neck supple.     Right lower leg: Edema present.     Left lower leg: Edema present.     Comments: Trace edema BLE  Skin:    General: Skin is warm and dry.  Neurological:     General: No focal deficit present.  Mental Status: He is alert. Mental status is at baseline.     Gait: Gait abnormal.     Comments: Oriented to person, his room  Psychiatric:        Mood and Affect: Mood normal.        Behavior: Behavior normal.     Labs reviewed: Recent Labs    10/22/23 0000 05/07/24 0000  NA 142 142  K 3.8 4.2  CL 102 103  CO2 30* 33*  BUN 32* 23*  CREATININE 1.0 0.9  CALCIUM  9.9 9.5   Recent Labs    10/22/23 0000  AST 21  ALT 27  ALKPHOS 64  ALBUMIN 4.5   Recent Labs    10/22/23 0000 11/05/23 0000  WBC 8.6 6.9  NEUTROABS  --  5,078.00  HGB 17.3 16.1  HCT 52 48  PLT 186 156    No results found for: HGBA1C Lab Results  Component Value Date   CHOL 144 10/22/2023   HDL 41 10/22/2023   LDLCALC 86 10/22/2023   TRIG 80 10/22/2023   CHOLHDL 3.5 11/02/2014    Significant Diagnostic Results in last 30 days:  No results found.  Assessment/Plan Essential hypertension Blood pressure is controlled, taking Losartan/hydrochlorothiazide, Bun/creat 23/0.85 05/07/24  Elevated hemoglobin (HCC) Elevated Hgb 16.1, plt 156 11/05/23, evaluated by oncology.   Deep Venous insufficiency of left lower extremity; without ulceration or inflammation Hx of DVT LE, evaluated and treated by Cardiovascular specialist.   Slow transit constipation prn MiraLax  Major neurocognitive disorder (HCC) progressing, needs more supervision and assistance for ADLs, 11/04/23 MMSE 9/30, TSH 2.54 11/05/23  Hyperlipidemia with target LDL less  than 70  taking Atorvastatin , ASA, LDL 86 10/22/23     Family/ staff Communication: Plan of care reviewed with the patient and charge nurse  Labs/tests ordered: None

## 2024-07-06 NOTE — Assessment & Plan Note (Signed)
 taking Atorvastatin , ASA, LDL 86 10/22/23

## 2024-07-06 NOTE — Assessment & Plan Note (Signed)
 Elevated Hgb 16.1, plt 156 11/05/23, evaluated by oncology.

## 2024-07-06 NOTE — Assessment & Plan Note (Signed)
 prn MiraLax

## 2024-07-06 NOTE — Assessment & Plan Note (Signed)
 progressing, needs more supervision and assistance for ADLs, 11/04/23 MMSE 9/30, TSH 2.54 11/05/23

## 2024-07-07 ENCOUNTER — Encounter: Payer: Self-pay | Admitting: Nurse Practitioner

## 2024-07-27 ENCOUNTER — Encounter: Payer: Self-pay | Admitting: Sports Medicine

## 2024-07-27 NOTE — Progress Notes (Deleted)
 Location:   [facility]  Nursing Home Room Number: [id number]-A Place of Service:  SNF (31) Provider:  Oren Blazing  PCP: Mast, Man X, NP  Patient Care Team: Mast, Man X, NP as PCP - General (Internal Medicine)  Extended Emergency Contact Information Primary Emergency Contact: Brandus,Lynn  [place] Home Phone: [phone] Mobile Phone: [phone] Relation: Daughter  Code Status:  DNR Goals of care: Advanced Directive information    07/27/2024   10:13 AM  Advanced Directives  Does Patient Have a Medical Advance Directive? Yes  Type of Advance Directive Out of facility DNR (pink MOST or yellow form)  Does patient want to make changes to medical advance directive? No - Patient declined     Chief Complaint  Patient presents with   Medical Management of Chronic Issues    Routine visit.     HPI:  Pt is a 89 y.o. male seen today for medical management of chronic diseases.     Past Medical History:  Diagnosis Date   Essential hypertension    History of DVT of lower extremity 2009   LEFT COMMON FEMORAL VEINS AND LEFT POSTERIOR TIBAL VEIS IN THE CALF;  stop taking  warfarin in 2010   Hyperlipidemia LDL goal <100    Memory difficulty 07/30/2017   Moderate aortic stenosis by prior echocardiography 10/2016   Progression from mild to moderate (2015 - 2017 Echo)   Past Surgical History:  Procedure Laterality Date   BILATERAL LOWER ATERIAL DOPPLER  10/23/2011   BILATERAL ABIs normal 1.2;   left lower venous  doppler  10/23/2011; 09/24/2014   a. L CFV - visble thrombus noted wthin the vessel without complete compressibilty,the complete common femoral vein demonstrate excellent flow throughout consistent with chronic disease; left popliteal vein -visble thrombus;demonstrares excellent flow troughout consistent with chronic disease;; b. 08/2014: L Lower Extr - no acute or chronic thrombosis or thrombophlebitis. Deep venous insufficiency   Lower  Extremity Venous Dopplers  05/2010   Chronic left popliteal venous thrombosis and acute small saphenous vein thrombosis in the left.   TRANSTHORACIC ECHOCARDIOGRAM  10/2016   Normal LV size and function (likely vigorous). EF 65-70% with normal wall motion. GR1 DD.   TRANSTHORACIC ECHOCARDIOGRAM  07/05/2008   EF =>55%; MILD MITRAL REGURG. Mild aortic valve calcification    Allergies  Allergen Reactions   Ace Inhibitors Cough    Allergies as of 07/27/2024       Reactions   Ace Inhibitors Cough        Medication List        Accurate as of July 27, 2024 10:13 AM. If you have any questions, ask your nurse or doctor.          aspirin 81 MG chewable tablet Chew 81 mg by mouth daily.   atorvastatin  20 MG tablet Commonly known as: LIPITOR Take 1 tablet (20 mg total) by mouth daily. <PLEASE MAKE APPOINTMENT FOR REFILLS>   GLUCOSAMINE PO Take 2 tablets by mouth daily.   hydrochlorothiazide 12.5 MG tablet Commonly known as: HYDRODIURIL Take 12.5 mg by mouth daily.   losartan 25 MG tablet Commonly known as: COZAAR Take 25 mg by mouth daily.   losartan 50 MG tablet Commonly known as: COZAAR Take 50 mg by mouth daily.   MiraLax 17 GM/SCOOP powder Generic drug: polyethylene glycol powder Take 17 g by mouth as needed for moderate constipation.   multivitamin tablet Take 1 tablet by mouth daily.  Review of Systems  Immunization History  Administered Date(s) Administered   Influenza, High Dose Seasonal PF 10/02/2023   Moderna Covid-19 Vaccine Bivalent Booster 4yrs & up 10/16/2023   PFIZER(Purple Top)SARS-COV-2 Vaccination 02/13/2020, 03/07/2020   PNEUMOCOCCAL CONJUGATE-20 06/10/2023   Tdap 06/10/2023   Zoster Recombinant(Shingrix) 11/29/2023, 01/27/2024   Pertinent  Health Maintenance Due  Topic Date Due   INFLUENZA VACCINE  07/31/2024       No data to display         Functional Status Survey:    Vitals:   07/27/24 1010  BP: 113/67  Pulse:  70  Resp: 16  Temp: (!) 97.3 F (36.3 C)  SpO2: 99%  Weight: 181 lb 6.4 oz (82.3 kg)  Height: 5' 8 (1.727 m)   Body mass index is 27.58 kg/m. Physical Exam  Labs reviewed: Recent Labs    10/22/23 0000 05/07/24 0000  NA 142 142  K 3.8 4.2  CL 102 103  CO2 30* 33*  BUN 32* 23*  CREATININE 1.0 0.9  CALCIUM  9.9 9.5   Recent Labs    10/22/23 0000  AST 21  ALT 27  ALKPHOS 64  ALBUMIN 4.5   Recent Labs    10/22/23 0000 11/05/23 0000  WBC 8.6 6.9  NEUTROABS  --  5,078.00  HGB 17.3 16.1  HCT 52 48  PLT 186 156   Lab Results  Component Value Date   TSH 2.854 ***Test methodology is 3rd generation TSH*** 06/10/2008   No results found for: HGBA1C Lab Results  Component Value Date   CHOL 144 10/22/2023   HDL 41 10/22/2023   LDLCALC 86 10/22/2023   TRIG 80 10/22/2023   CHOLHDL 3.5 11/02/2014    Significant Diagnostic Results in last 30 days:  No results found.  Assessment/Plan There are no diagnoses linked to this encounter.   Family/ staff Communication:   Labs/tests ordered:

## 2024-07-27 NOTE — Progress Notes (Signed)
 This encounter was created in error - please disregard.

## 2024-08-04 ENCOUNTER — Encounter: Payer: Self-pay | Admitting: Nurse Practitioner

## 2024-08-04 ENCOUNTER — Non-Acute Institutional Stay (SKILLED_NURSING_FACILITY): Payer: Self-pay | Admitting: Nurse Practitioner

## 2024-08-04 DIAGNOSIS — F0390 Unspecified dementia without behavioral disturbance: Secondary | ICD-10-CM

## 2024-08-04 DIAGNOSIS — D582 Other hemoglobinopathies: Secondary | ICD-10-CM | POA: Diagnosis not present

## 2024-08-04 DIAGNOSIS — I872 Venous insufficiency (chronic) (peripheral): Secondary | ICD-10-CM | POA: Diagnosis not present

## 2024-08-04 DIAGNOSIS — I10 Essential (primary) hypertension: Secondary | ICD-10-CM

## 2024-08-04 DIAGNOSIS — K5901 Slow transit constipation: Secondary | ICD-10-CM | POA: Diagnosis not present

## 2024-08-04 DIAGNOSIS — E785 Hyperlipidemia, unspecified: Secondary | ICD-10-CM

## 2024-08-04 NOTE — Assessment & Plan Note (Signed)
 Asymptomatic, evaluated and treated by Cardiovascular specialist.

## 2024-08-04 NOTE — Assessment & Plan Note (Signed)
 Elevated Hgb 16.1, plt 156 11/05/23, evaluated by oncology.

## 2024-08-04 NOTE — Assessment & Plan Note (Signed)
 taking Atorvastatin , ASA, LDL 86 10/22/23

## 2024-08-04 NOTE — Assessment & Plan Note (Signed)
Stable, prn MiraLax

## 2024-08-04 NOTE — Assessment & Plan Note (Signed)
 Blood pressure is not controlled,  taking Losartan/hydrochlorothiazide, Bun/creat 23/0.85 05/07/24

## 2024-08-04 NOTE — Assessment & Plan Note (Signed)
 No behavioral issues, progressing, needs more supervision and assistance for ADLs, 11/04/23 MMSE 9/30, TSH 2.54 11/05/23

## 2024-08-04 NOTE — Progress Notes (Unsigned)
 Location:  [facility]  Nursing Home Room Number: [id number]-A Place of Service:  SNF (31) Provider: Betzy Barbier X, NP  Marius Betts X, NP  Patient Care Team: Darrie Macmillan X, NP as PCP - General (Internal Medicine)  Extended Emergency Contact Information Primary Emergency Contact: Brandus,Lynn  [place] Home Phone: [phone] Mobile Phone: [phone] Relation: Daughter  Code Status:  DNR Goals of care: Advanced Directive information    08/04/2024   11:52 AM  Advanced Directives  Does Patient Have a Medical Advance Directive? Yes  Type of Advance Directive Out of facility DNR (pink MOST or yellow form)  Does patient want to make changes to medical advance directive? No - Patient declined     Chief Complaint  Patient presents with   Medical Management of Chronic Issues    Routine Visit     HPI:  Pt is a 89 y.o. male seen today for medical management of chronic diseases.    Dementia, progressing, needs more supervision and assistance for ADLs, 11/04/23 MMSE 9/30, TSH 2.54 11/05/23             HLD, taking Atorvastatin , ASA, LDL 86 10/22/23             HTN, taking Losartan/hydrochlorothiazide, Bun/creat 23/0.85 05/07/24             Elevated Hgb 16.1, plt 156 11/05/23, evaluated by oncology.              Hx of DVT LE, evaluated and treated by Cardiovascular specialist.              Constipation, prn MiraLax    Past Medical History:  Diagnosis Date   Essential hypertension    History of DVT of lower extremity 2009   LEFT COMMON FEMORAL VEINS AND LEFT POSTERIOR TIBAL VEIS IN THE CALF;  stop taking  warfarin in 2010   Hyperlipidemia LDL goal <100    Memory difficulty 07/30/2017   Moderate aortic stenosis by prior echocardiography 10/2016   Progression from mild to moderate (2015 - 2017 Echo)   Past Surgical History:  Procedure Laterality Date   BILATERAL LOWER ATERIAL DOPPLER  10/23/2011   BILATERAL ABIs normal 1.2;   left lower venous  doppler  10/23/2011;  09/24/2014   a. L CFV - visble thrombus noted wthin the vessel without complete compressibilty,the complete common femoral vein demonstrate excellent flow throughout consistent with chronic disease; left popliteal vein -visble thrombus;demonstrares excellent flow troughout consistent with chronic disease;; b. 08/2014: L Lower Extr - no acute or chronic thrombosis or thrombophlebitis. Deep venous insufficiency   Lower Extremity Venous Dopplers  05/2010   Chronic left popliteal venous thrombosis and acute small saphenous vein thrombosis in the left.   TRANSTHORACIC ECHOCARDIOGRAM  10/2016   Normal LV size and function (likely vigorous). EF 65-70% with normal wall motion. GR1 DD.   TRANSTHORACIC ECHOCARDIOGRAM  07/05/2008   EF =>55%; MILD MITRAL REGURG. Mild aortic valve calcification    Allergies  Allergen Reactions   Ace Inhibitors Cough    Allergies as of 08/04/2024       Reactions   Ace Inhibitors Cough        Medication List        Accurate as of August 04, 2024 12:00 PM. If you have any questions, ask your nurse or doctor.          aspirin 81 MG chewable tablet Chew 81 mg by mouth daily.   atorvastatin  20 MG tablet  Commonly known as: LIPITOR Take 1 tablet (20 mg total) by mouth daily. <PLEASE MAKE APPOINTMENT FOR REFILLS>   GLUCOSAMINE PO Take 2 tablets by mouth daily.   hydrochlorothiazide 12.5 MG tablet Commonly known as: HYDRODIURIL Take 12.5 mg by mouth daily.   losartan 50 MG tablet Commonly known as: COZAAR Take 50 mg by mouth daily. What changed: Another medication with the same name was removed. Continue taking this medication, and follow the directions you see here. Changed by: Allanna Bresee X Laurelle Skiver   MiraLax 17 GM/SCOOP powder Generic drug: polyethylene glycol powder Take 17 g by mouth as needed for moderate constipation.   multivitamin tablet Take 1 tablet by mouth daily.        Review of Systems  Constitutional:  Negative for appetite change, fatigue  and fever.  HENT:  Negative for congestion and trouble swallowing.   Eyes:  Negative for visual disturbance.  Respiratory:  Negative for cough and shortness of breath.   Cardiovascular:  Positive for leg swelling.  Gastrointestinal:  Negative for abdominal pain and constipation.  Genitourinary:  Negative for dysuria and urgency.  Musculoskeletal:  Positive for gait problem.  Skin:  Negative for color change.  Neurological:  Negative for speech difficulty and headaches.       Memory deficits.   Psychiatric/Behavioral:  Positive for confusion. Negative for sleep disturbance.     Immunization History  Administered Date(s) Administered   Influenza, High Dose Seasonal PF 10/02/2023   Moderna Covid-19 Vaccine Bivalent Booster 54yrs & up 10/16/2023   PFIZER(Purple Top)SARS-COV-2 Vaccination 02/13/2020, 03/07/2020   PNEUMOCOCCAL CONJUGATE-20 06/10/2023   Tdap 06/10/2023   Zoster Recombinant(Shingrix) 11/29/2023, 01/27/2024   Pertinent  Health Maintenance Due  Topic Date Due   INFLUENZA VACCINE  07/31/2024       No data to display         Functional Status Survey:    Vitals:   08/04/24 1143  BP: 108/61  Pulse: 66  Resp: 17  Temp: 97.6 F (36.4 C)  SpO2: 99%  Weight: 182 lb 11.2 oz (82.9 kg)  Height: 5' 8 (1.727 m)   Body mass index is 27.78 kg/m. Physical Exam Vitals and nursing note reviewed.  Constitutional:      Appearance: Normal appearance.  HENT:     Head: Normocephalic and atraumatic.     Nose: Nose normal.     Mouth/Throat:     Mouth: Mucous membranes are moist.  Eyes:     Extraocular Movements: Extraocular movements intact.     Conjunctiva/sclera: Conjunctivae normal.     Pupils: Pupils are equal, round, and reactive to light.  Cardiovascular:     Rate and Rhythm: Normal rate.     Heart sounds: Murmur heard.  Pulmonary:     Effort: Pulmonary effort is normal.     Breath sounds: No rales.  Abdominal:     General: Bowel sounds are normal.      Palpations: Abdomen is soft.     Tenderness: There is no abdominal tenderness.  Musculoskeletal:     Cervical back: Normal range of motion and neck supple.     Right lower leg: Edema present.     Left lower leg: Edema present.     Comments: Trace edema BLE  Skin:    General: Skin is warm and dry.  Neurological:     General: No focal deficit present.     Mental Status: He is alert. Mental status is at baseline.     Gait: Gait abnormal.  Comments: Oriented to person, his room  Psychiatric:        Mood and Affect: Mood normal.        Behavior: Behavior normal.     Labs reviewed: Recent Labs    10/22/23 0000 05/07/24 0000  NA 142 142  K 3.8 4.2  CL 102 103  CO2 30* 33*  BUN 32* 23*  CREATININE 1.0 0.9  CALCIUM  9.9 9.5   Recent Labs    10/22/23 0000  AST 21  ALT 27  ALKPHOS 64  ALBUMIN 4.5   Recent Labs    10/22/23 0000 11/05/23 0000  WBC 8.6 6.9  NEUTROABS  --  5,078.00  HGB 17.3 16.1  HCT 52 48  PLT 186 156   Lab Results  Component Value Date   TSH 2.854 ***Test methodology is 3rd generation TSH*** 06/10/2008   No results found for: HGBA1C Lab Results  Component Value Date   CHOL 144 10/22/2023   HDL 41 10/22/2023   LDLCALC 86 10/22/2023   TRIG 80 10/22/2023   CHOLHDL 3.5 11/02/2014    Significant Diagnostic Results in last 30 days:  No results found.  Assessment/Plan Deep Venous insufficiency of left lower extremity; without ulceration or inflammation Asymptomatic, evaluated and treated by Cardiovascular specialist.   Slow transit constipation Stable,  prn MiraLax    Elevated hemoglobin (HCC) Elevated Hgb 16.1, plt 156 11/05/23, evaluated by oncology.   Essential hypertension Blood pressure is not controlled,  taking Losartan/hydrochlorothiazide, Bun/creat 23/0.85 05/07/24  Hyperlipidemia with target LDL less than 70  taking Atorvastatin , ASA, LDL 86 10/22/23    Family/ staff Communication: Plan of care reviewed with the patient  and charge nurse  Labs/tests ordered: None

## 2024-08-06 ENCOUNTER — Encounter: Payer: Self-pay | Admitting: Nurse Practitioner

## 2024-09-11 ENCOUNTER — Encounter: Payer: Self-pay | Admitting: Sports Medicine

## 2024-09-11 ENCOUNTER — Non-Acute Institutional Stay (SKILLED_NURSING_FACILITY): Payer: Self-pay | Admitting: Sports Medicine

## 2024-09-11 DIAGNOSIS — F0390 Unspecified dementia without behavioral disturbance: Secondary | ICD-10-CM

## 2024-09-11 DIAGNOSIS — E782 Mixed hyperlipidemia: Secondary | ICD-10-CM | POA: Diagnosis not present

## 2024-09-11 DIAGNOSIS — K5900 Constipation, unspecified: Secondary | ICD-10-CM

## 2024-09-11 DIAGNOSIS — I10 Essential (primary) hypertension: Secondary | ICD-10-CM | POA: Diagnosis not present

## 2024-09-11 NOTE — Progress Notes (Signed)
 Location:  [facility]  Nursing Home Room Number: 106-A Place of Service:  SNF (31) Provider:  Sherlynn Albert, MD   Mast, Man X, NP  Patient Care Team: Mast, Man X, NP as PCP - General (Internal Medicine)  Extended Emergency Contact Information Primary Emergency Contact: Brandus,Lynn  [place] Home Phone: [phone] Mobile Phone: [phone] Relation: Daughter  Code Status:  DNR Goals of care: Advanced Directive information    09/11/2024   11:55 AM  Advanced Directives  Does Patient Have a Medical Advance Directive? Yes  Type of Advance Directive Out of facility DNR (pink MOST or yellow form)  Does patient want to make changes to medical advance directive? No - Patient declined     No chief complaint on file.   HPI:  Pt is a 89 y.o. male  with PMH of  dementia, HTN, HLD, H/o DVT is seen today for medical management of chronic diseases.   Pt seen and examined in his room He seems pleasant and comfortable and does not appear to be in distress. He is able to ambulate independently  Pt denies chest pain, palpitations, SOB, abdominal pain, nausea, vomiting, dysuria, hematuria, bloody or dark stools.  BASIC METABOLIC PANEL GLUCOSE 80 mg/dL 34-00 Final  Fasting reference interval UREA NITROGEN (BUN) 23 mg/dL 2-74 Final CREATININE 9.14 mg/dL 9.29-8.77 Final EGFR 85 mL/min/1.73 m2 > OR = 60 Final BUN/CREATININE RATIO SEE NOTE: (calc) 6-22 Final  Not Reported: BUN and Creatinine are within  reference range. SODIUM 142 mmol/L 135-146 Final POTASSIUM 4.2 mmol/L 3.5-5.3 Final CHLORIDE 103 mmol/L 98-110 Final CARBON DIOXIDE 33 mmol/L 20-32 H Final CALCIUM  9.5 mg/dL 1.3-89.6 Final   Past Medical History:  Diagnosis Date   Essential hypertension    History of DVT of lower extremity 2009   LEFT COMMON FEMORAL VEINS AND LEFT POSTERIOR TIBAL VEIS IN THE CALF;  stop taking  warfarin in 2010   Hyperlipidemia LDL goal <100    Memory  difficulty 07/30/2017   Moderate aortic stenosis by prior echocardiography 10/2016   Progression from mild to moderate (2015 - 2017 Echo)   Past Surgical History:  Procedure Laterality Date   BILATERAL LOWER ATERIAL DOPPLER  10/23/2011   BILATERAL ABIs normal 1.2;   left lower venous  doppler  10/23/2011; 09/24/2014   a. L CFV - visble thrombus noted wthin the vessel without complete compressibilty,the complete common femoral vein demonstrate excellent flow throughout consistent with chronic disease; left popliteal vein -visble thrombus;demonstrares excellent flow troughout consistent with chronic disease;; b. 08/2014: L Lower Extr - no acute or chronic thrombosis or thrombophlebitis. Deep venous insufficiency   Lower Extremity Venous Dopplers  05/2010   Chronic left popliteal venous thrombosis and acute small saphenous vein thrombosis in the left.   TRANSTHORACIC ECHOCARDIOGRAM  10/2016   Normal LV size and function (likely vigorous). EF 65-70% with normal wall motion. GR1 DD.   TRANSTHORACIC ECHOCARDIOGRAM  07/05/2008   EF =>55%; MILD MITRAL REGURG. Mild aortic valve calcification    Allergies  Allergen Reactions   Ace Inhibitors Cough    Allergies as of 09/11/2024       Reactions   Ace Inhibitors Cough        Medication List        Accurate as of September 11, 2024 12:06 PM. If you have any questions, ask your nurse or doctor.          aspirin 81 MG chewable tablet Chew 81 mg by mouth daily.  atorvastatin  20 MG tablet Commonly known as: LIPITOR Take 1 tablet (20 mg total) by mouth daily. <PLEASE MAKE APPOINTMENT FOR REFILLS>   GLUCOSAMINE PO Take 2 tablets by mouth daily.   hydrochlorothiazide 12.5 MG tablet Commonly known as: HYDRODIURIL Take 12.5 mg by mouth daily.   losartan 50 MG tablet Commonly known as: COZAAR Take 50 mg by mouth daily.   MiraLax 17 GM/SCOOP powder Generic drug: polyethylene glycol powder Take 17 g by mouth as needed for moderate  constipation.   multivitamin tablet Take 1 tablet by mouth daily. Centrum Silver        Review of Systems  Constitutional:  Negative for chills and fever.  Respiratory:  Negative for cough and shortness of breath.   Cardiovascular:  Negative for chest pain and leg swelling.  Gastrointestinal:  Negative for abdominal pain, blood in stool, constipation, diarrhea, nausea and vomiting.  Genitourinary:  Negative for dysuria.  Neurological:  Negative for dizziness.  Psychiatric/Behavioral:  Negative for confusion.     Immunization History  Administered Date(s) Administered   INFLUENZA, HIGH DOSE SEASONAL PF 10/02/2023   Moderna Covid-19 Vaccine Bivalent Booster 74yrs & up 10/16/2023   PFIZER(Purple Top)SARS-COV-2 Vaccination 02/13/2020, 03/07/2020   PNEUMOCOCCAL CONJUGATE-20 06/10/2023   Tdap 06/10/2023   Zoster Recombinant(Shingrix) 11/29/2023, 01/27/2024   Pertinent  Health Maintenance Due  Topic Date Due   Influenza Vaccine  07/31/2024       No data to display         Functional Status Survey:    Vitals:   09/11/24 1156  BP: 128/69  Pulse: 63  Resp: 17  Temp: 97.6 F (36.4 C)  SpO2: 99%  Weight: 192 lb 9.6 oz (87.4 kg)  Height: 5' 8 (1.727 m)   Body mass index is 29.28 kg/m. Physical Exam Constitutional:      Appearance: Normal appearance.  HENT:     Head: Normocephalic and atraumatic.  Cardiovascular:     Rate and Rhythm: Normal rate and regular rhythm.     Pulses: Normal pulses.     Heart sounds: Normal heart sounds.  Pulmonary:     Effort: No respiratory distress.     Breath sounds: No stridor. No wheezing or rales.  Abdominal:     General: Bowel sounds are normal. There is no distension.     Palpations: Abdomen is soft.     Tenderness: There is no abdominal tenderness. There is no guarding.  Musculoskeletal:        General: No swelling.  Neurological:     Mental Status: He is alert. Mental status is at baseline.     Motor: No weakness.      Labs reviewed: Recent Labs    10/22/23 0000 05/07/24 0000  NA 142 142  K 3.8 4.2  CL 102 103  CO2 30* 33*  BUN 32* 23*  CREATININE 1.0 0.9  CALCIUM  9.9 9.5   Recent Labs    10/22/23 0000  AST 21  ALT 27  ALKPHOS 64  ALBUMIN 4.5   Recent Labs    10/22/23 0000 11/05/23 0000  WBC 8.6 6.9  NEUTROABS  --  5,078.00  HGB 17.3 16.1  HCT 52 48  PLT 186 156   Lab Results  Component Value Date   TSH 2.854   06/10/2008   No results found for: HGBA1C Lab Results  Component Value Date   CHOL 144 10/22/2023   HDL 41 10/22/2023   LDLCALC 86 10/22/2023   TRIG 80 10/22/2023  CHOLHDL 3.5 11/02/2014    Significant Diagnostic Results in last 30 days:  No results found.  Assessment/Plan  1. Major neurocognitive disorder (HCC) (Primary) No behavioral manifestations Increase cognitively engaging activities and physical activity   2. Primary hypertension Cont with losartan, hydrochlorothiazide Avoid salty foods  3. Mixed hyperlipidemia Denies muscle cramps Cont with lipitor  4. Constipation, unspecified constipation type Cont with miralax

## 2024-10-01 ENCOUNTER — Non-Acute Institutional Stay (SKILLED_NURSING_FACILITY): Payer: Self-pay | Admitting: Nurse Practitioner

## 2024-10-01 ENCOUNTER — Encounter: Payer: Self-pay | Admitting: Nurse Practitioner

## 2024-10-01 DIAGNOSIS — D582 Other hemoglobinopathies: Secondary | ICD-10-CM

## 2024-10-01 DIAGNOSIS — E785 Hyperlipidemia, unspecified: Secondary | ICD-10-CM

## 2024-10-01 DIAGNOSIS — I10 Essential (primary) hypertension: Secondary | ICD-10-CM

## 2024-10-01 DIAGNOSIS — R609 Edema, unspecified: Secondary | ICD-10-CM | POA: Insufficient documentation

## 2024-10-01 DIAGNOSIS — F0390 Unspecified dementia without behavioral disturbance: Secondary | ICD-10-CM

## 2024-10-01 DIAGNOSIS — I872 Venous insufficiency (chronic) (peripheral): Secondary | ICD-10-CM

## 2024-10-01 NOTE — Assessment & Plan Note (Signed)
 progressing, needs more supervision and assistance for ADLs, 11/04/23 MMSE 9/30, TSH 2.54 11/05/23

## 2024-10-01 NOTE — Assessment & Plan Note (Signed)
 Blood pressure is controlled, taking Losartan/hydrochlorothiazide, Bun/creat 23/0.85 05/07/24

## 2024-10-01 NOTE — Assessment & Plan Note (Signed)
 Hx of DVT LE, evaluated and treated by Cardiovascular specialist.

## 2024-10-01 NOTE — Progress Notes (Addendum)
 Location:  [facility]  Nursing Home Room Number: [id number] Place of Service:  SNF (31) Provider:  Ajeenah Heiny X, NP  Brizeida Mcmurry X, NP  Patient Care Team: Georgia Baria X, NP as PCP - General (Internal Medicine)  Extended Emergency Contact Information Primary Emergency Contact: Brandus,Lynn  [place] Home Phone: [phone] Mobile Phone: [phone] Relation: Daughter  Code Status:  DNR  Goals of care: Advanced Directive information    10/01/2024    9:43 AM  Advanced Directives  Does Patient Have a Medical Advance Directive? Yes  Type of Advance Directive Out of facility DNR (pink MOST or yellow form)  Does patient want to make changes to medical advance directive? No - Patient declined  Pre-existing out of facility DNR order (yellow form or pink MOST form) Pink MOST form placed in chart (order not valid for inpatient use);Yellow form placed in chart (order not valid for inpatient use)     Chief Complaint  Patient presents with   Medical Management of Chronic Issues    Routine Visit     HPI:  Pt is a 88 y.o. male seen today for medical management of chronic diseases.     Dementia, progressing, needs more supervision and assistance for ADLs, 11/04/23 MMSE 9/30, TSH 2.54 11/05/23             HLD, taking Atorvastatin , ASA, LDL 86 10/22/23             HTN, taking Losartan/hydrochlorothiazide, Bun/creat 23/0.85 05/07/24             Elevated Hgb 16.1, plt 156 11/05/23, evaluated by oncology.              Hx of DVT LE, evaluated and treated by Cardiovascular specialist.              Constipation, prn MiraLax  Edema BLE, mild, weight gained about # 10Ibs in the past 2 months, no respiratory symptoms.   Past Medical History:  Diagnosis Date   Essential hypertension    History of DVT of lower extremity 2009   LEFT COMMON FEMORAL VEINS AND LEFT POSTERIOR TIBAL VEIS IN THE CALF;  stop taking  warfarin in 2010   Hyperlipidemia LDL goal <100    Memory difficulty  07/30/2017   Moderate aortic stenosis by prior echocardiography 10/2016   Progression from mild to moderate (2015 - 2017 Echo)   Past Surgical History:  Procedure Laterality Date   BILATERAL LOWER ATERIAL DOPPLER  10/23/2011   BILATERAL ABIs normal 1.2;   left lower venous  doppler  10/23/2011; 09/24/2014   a. L CFV - visble thrombus noted wthin the vessel without complete compressibilty,the complete common femoral vein demonstrate excellent flow throughout consistent with chronic disease; left popliteal vein -visble thrombus;demonstrares excellent flow troughout consistent with chronic disease;; b. 08/2014: L Lower Extr - no acute or chronic thrombosis or thrombophlebitis. Deep venous insufficiency   Lower Extremity Venous Dopplers  05/2010   Chronic left popliteal venous thrombosis and acute small saphenous vein thrombosis in the left.   TRANSTHORACIC ECHOCARDIOGRAM  10/2016   Normal LV size and function (likely vigorous). EF 65-70% with normal wall motion. GR1 DD.   TRANSTHORACIC ECHOCARDIOGRAM  07/05/2008   EF =>55%; MILD MITRAL REGURG. Mild aortic valve calcification    Allergies  Allergen Reactions   Ace Inhibitors Cough    Allergies as of 10/01/2024       Reactions   Ace Inhibitors Cough  Medication List        Accurate as of October 01, 2024 11:59 PM. If you have any questions, ask your nurse or doctor.          aspirin 81 MG chewable tablet Chew 81 mg by mouth daily.   atorvastatin  20 MG tablet Commonly known as: LIPITOR Take 1 tablet (20 mg total) by mouth daily. <PLEASE MAKE APPOINTMENT FOR REFILLS>   GLUCOSAMINE PO Take 2 tablets by mouth daily.   hydrochlorothiazide 12.5 MG tablet Commonly known as: HYDRODIURIL Take 12.5 mg by mouth daily.   losartan 50 MG tablet Commonly known as: COZAAR Take 50 mg by mouth daily.   MiraLax 17 GM/SCOOP powder Generic drug: polyethylene glycol powder Take 17 g by mouth as needed for moderate constipation.    multivitamin tablet Take 1 tablet by mouth daily. Centrum Silver        Review of Systems  Constitutional:  Negative for appetite change, fatigue and fever.  HENT:  Negative for congestion and trouble swallowing.   Eyes:  Negative for visual disturbance.  Respiratory:  Negative for cough and shortness of breath.   Cardiovascular:  Positive for leg swelling.  Gastrointestinal:  Negative for abdominal pain and constipation.  Genitourinary:  Negative for dysuria and urgency.  Musculoskeletal:  Positive for gait problem.  Skin:  Negative for color change.  Neurological:  Negative for speech difficulty and headaches.       Memory deficits.   Psychiatric/Behavioral:  Positive for confusion. Negative for sleep disturbance.     Immunization History  Administered Date(s) Administered   INFLUENZA, HIGH DOSE SEASONAL PF 10/02/2023   Moderna Covid-19 Vaccine Bivalent Booster 27yrs & up 10/16/2023   PFIZER(Purple Top)SARS-COV-2 Vaccination 02/13/2020, 03/07/2020   PNEUMOCOCCAL CONJUGATE-20 06/10/2023   Tdap 06/10/2023   Zoster Recombinant(Shingrix) 11/29/2023, 01/27/2024   Pertinent  Health Maintenance Due  Topic Date Due   Influenza Vaccine  07/31/2024       No data to display         Functional Status Survey:    Vitals:   10/01/24 0941  BP: 114/70  Pulse: 84  Resp: 18  Temp: 98.3 F (36.8 C)  SpO2: 99%  Weight: 192 lb 3.2 oz (87.2 kg)   Body mass index is 29.22 kg/m. Physical Exam Vitals and nursing note reviewed.  Constitutional:      Appearance: Normal appearance.  HENT:     Head: Normocephalic and atraumatic.     Nose: Nose normal.     Mouth/Throat:     Mouth: Mucous membranes are moist.  Eyes:     Extraocular Movements: Extraocular movements intact.     Conjunctiva/sclera: Conjunctivae normal.     Pupils: Pupils are equal, round, and reactive to light.  Cardiovascular:     Rate and Rhythm: Normal rate.     Heart sounds: Murmur heard.  Pulmonary:      Effort: Pulmonary effort is normal.     Breath sounds: No rales.  Abdominal:     General: Bowel sounds are normal.     Palpations: Abdomen is soft.     Tenderness: There is no abdominal tenderness.  Musculoskeletal:     Cervical back: Normal range of motion and neck supple.     Right lower leg: Edema present.     Left lower leg: Edema present.     Comments: Trace-1+ edema BLE  Skin:    General: Skin is warm and dry.  Neurological:     General: No focal  deficit present.     Mental Status: He is alert. Mental status is at baseline.     Gait: Gait abnormal.     Comments: Oriented to person, his room  Psychiatric:        Mood and Affect: Mood normal.        Behavior: Behavior normal.     Labs reviewed: Recent Labs    10/22/23 0000 05/07/24 0000  NA 142 142  K 3.8 4.2  CL 102 103  CO2 30* 33*  BUN 32* 23*  CREATININE 1.0 0.9  CALCIUM  9.9 9.5   Recent Labs    10/22/23 0000  AST 21  ALT 27  ALKPHOS 64  ALBUMIN 4.5   Recent Labs    10/22/23 0000 11/05/23 0000  WBC 8.6 6.9  NEUTROABS  --  5,078.00  HGB 17.3 16.1  HCT 52 48  PLT 186 156    No results found for: HGBA1C Lab Results  Component Value Date   CHOL 144 10/22/2023   HDL 41 10/22/2023   LDLCALC 86 10/22/2023   TRIG 80 10/22/2023   CHOLHDL 3.5 11/02/2014    Significant Diagnostic Results in last 30 days:  No results found.  Assessment/Plan Edema  mild, weight gained about # 10Ibs in the past 2 months, no respiratory symptoms.  Weekly weight, obtain CBC/diff, CMP/eGFR, BNP, TSH, lipids 10/08/24 BNP 344,  Weight weekly, starting Furosemide 10mg /Kcl 10meq po every day, obtain Echocardiogram.   Major neurocognitive disorder (HCC) progressing, needs more supervision and assistance for ADLs, 11/04/23 MMSE 9/30, TSH 2.54 11/05/23  Hyperlipidemia with target LDL less than 70  taking Atorvastatin , ASA, LDL 86 10/22/23  Essential hypertension Blood pressure is controlled, taking  Losartan/hydrochlorothiazide, Bun/creat 23/0.85 05/07/24  Elevated hemoglobin   Elevated Hgb 16.1, plt 156 11/05/23, evaluated by oncology.   Deep Venous insufficiency of left lower extremity; without ulceration or inflammation  Hx of DVT LE, evaluated and treated by Cardiovascular specialist.      Family/ staff Communication: plan of care reviewed with the patient and charge nurse.   Labs/tests ordered:  CBC/diff, CMP/eGFR, BNP, TSH, lipid panel.

## 2024-10-01 NOTE — Assessment & Plan Note (Addendum)
 mild, weight gained about # 10Ibs in the past 2 months, no respiratory symptoms.  Weekly weight, obtain CBC/diff, CMP/eGFR, BNP, TSH, lipids 10/08/24 BNP 344,  Weight weekly, starting Furosemide 10mg /Kcl 10meq po every day, obtain Echocardiogram.

## 2024-10-01 NOTE — Assessment & Plan Note (Signed)
 Elevated Hgb 16.1, plt 156 11/05/23, evaluated by oncology.

## 2024-10-01 NOTE — Assessment & Plan Note (Signed)
 taking Atorvastatin , ASA, LDL 86 10/22/23

## 2024-10-06 LAB — HEPATIC FUNCTION PANEL
ALT: 30 U/L (ref 10–40)
AST: 22 (ref 14–40)
Alkaline Phosphatase: 66 (ref 25–125)
Bilirubin, Total: 1.3

## 2024-10-06 LAB — LIPID PANEL
Cholesterol: 143 (ref 0–200)
HDL: 49 (ref 35–70)
LDL Cholesterol: 74
LDl/HDL Ratio: 2.9
Triglycerides: 122 (ref 40–160)

## 2024-10-06 LAB — COMPREHENSIVE METABOLIC PANEL WITH GFR
Albumin: 4.2 (ref 3.5–5.0)
Calcium: 9.4 (ref 8.7–10.7)
Globulin: 2.6
eGFR: 69

## 2024-10-06 LAB — CBC AND DIFFERENTIAL
HCT: 50 (ref 41–53)
Hemoglobin: 17 (ref 13.5–17.5)
Neutrophils Absolute: 6769
Platelets: 190 K/uL (ref 150–400)
WBC: 8.7

## 2024-10-06 LAB — TSH: TSH: 3.46 (ref 0.41–5.90)

## 2024-10-06 LAB — BASIC METABOLIC PANEL WITH GFR
BUN: 26 — AB (ref 4–21)
CO2: 26 — AB (ref 13–22)
Chloride: 103 (ref 99–108)
Creatinine: 1.1 (ref 0.6–1.3)
Glucose: 115
Potassium: 4 meq/L (ref 3.5–5.1)
Sodium: 141 (ref 137–147)

## 2024-10-06 LAB — CBC: RBC: 5.44 — AB (ref 3.87–5.11)

## 2024-10-09 ENCOUNTER — Non-Acute Institutional Stay (SKILLED_NURSING_FACILITY): Payer: Self-pay | Admitting: Nurse Practitioner

## 2024-10-09 ENCOUNTER — Encounter: Payer: Self-pay | Admitting: Nurse Practitioner

## 2024-10-09 DIAGNOSIS — F0390 Unspecified dementia without behavioral disturbance: Secondary | ICD-10-CM

## 2024-10-09 DIAGNOSIS — E785 Hyperlipidemia, unspecified: Secondary | ICD-10-CM | POA: Diagnosis not present

## 2024-10-09 DIAGNOSIS — I10 Essential (primary) hypertension: Secondary | ICD-10-CM | POA: Diagnosis not present

## 2024-10-09 DIAGNOSIS — M79604 Pain in right leg: Secondary | ICD-10-CM | POA: Diagnosis not present

## 2024-10-09 DIAGNOSIS — R609 Edema, unspecified: Secondary | ICD-10-CM

## 2024-10-09 DIAGNOSIS — D582 Other hemoglobinopathies: Secondary | ICD-10-CM

## 2024-10-09 MED ORDER — POTASSIUM CHLORIDE CRYS ER 20 MEQ PO TBCR
20.0000 meq | EXTENDED_RELEASE_TABLET | Freq: Every day | ORAL | Status: AC
Start: 2024-10-09 — End: ?

## 2024-10-09 MED ORDER — FUROSEMIDE 20 MG PO TABS
20.0000 mg | ORAL_TABLET | Freq: Every day | ORAL | Status: AC
Start: 2024-10-09 — End: ?

## 2024-10-09 NOTE — Assessment & Plan Note (Signed)
 eported R leg pain 10/08/24, no pain upon my examination today.   X-ray L spine, pelvis, R hip/femur/knee ordered 10/08/24-the patient refused.

## 2024-10-09 NOTE — Assessment & Plan Note (Signed)
 taking Atorvastatin , ASA, LDL 74 10/06/24

## 2024-10-09 NOTE — Assessment & Plan Note (Signed)
 Blood pressure is controlled, taking Losartan/hydrochlorothiazide, Bun/creat 26/1.05 10/06/24

## 2024-10-09 NOTE — Assessment & Plan Note (Signed)
 Elevated Hgb 16.1, plt 156 11/05/23, 17/190 10/06/24, evaluated by oncology.              Hx of DVT LE, evaluated and treated by Cardiovascular specialist.

## 2024-10-09 NOTE — Assessment & Plan Note (Signed)
 Edema BLE, mild, weight gained about # 10Ibs in the past 2 months, BNP 344 10/08/24, no respiratory symptoms, on low dose of Furosemide, Na 141, K 4.0 10/06/24

## 2024-10-09 NOTE — Assessment & Plan Note (Signed)
 progressing, needs more supervision and assistance for ADLs, 11/04/23 MMSE 9/30, TSH 3.46 10/06/24

## 2024-10-09 NOTE — Progress Notes (Signed)
 Location:   [facility] Room Number: [id number] Place of Service:  SNF (31) Provider: Larwance Shaniya Tashiro NP  Yeimy Brabant X, NP  Patient Care Team: Pieter Fooks X, NP as PCP - General (Internal Medicine)  Extended Emergency Contact Information Primary Emergency Contact: Brandus,Lynn  [place] Home Phone: [phone] Mobile Phone: [phone] Relation: Daughter  Code Status: DNR Goals of care: Advanced Directive information    10/01/2024    9:43 AM  Advanced Directives  Does Patient Have a Medical Advance Directive? Yes  Type of Advance Directive Out of facility DNR (pink MOST or yellow form)  Does patient want to make changes to medical advance directive? No - Patient declined  Pre-existing out of facility DNR order (yellow form or pink MOST form) Pink MOST form placed in chart (order not valid for inpatient use);Yellow form placed in chart (order not valid for inpatient use)     Chief Complaint  Patient presents with   Acute Visit    Reported R leg pain    HPI:  Pt is a 89 y.o. male seen today for an acute visit for reported R leg pain 10/08/24, no pain upon my examination today.    X-ray L spine, pelvis, R hip/femur/knee ordered 10/08/24-the patient refused.    Dementia, progressing, needs more supervision and assistance for ADLs, 11/04/23 MMSE 9/30, TSH 3.46 10/06/24             HLD, taking Atorvastatin , ASA, LDL 74 10/06/24             HTN, taking Losartan/hydrochlorothiazide, Bun/creat 26/1.05 10/06/24             Elevated Hgb 16.1, plt 156 11/05/23, 17/190 10/06/24, evaluated by oncology.              Hx of DVT LE, evaluated and treated by Cardiovascular specialist.              Constipation, prn MiraLax             Edema BLE, mild, weight gained about # 10Ibs in the past 2 months, no respiratory symptoms, on low dose of Furosemide, Na 141, K 4.0 10/06/24     Past Medical History:  Diagnosis Date   Essential hypertension    History of DVT of lower extremity  2009   LEFT COMMON FEMORAL VEINS AND LEFT POSTERIOR TIBAL VEIS IN THE CALF;  stop taking  warfarin in 2010   Hyperlipidemia LDL goal <100    Memory difficulty 07/30/2017   Moderate aortic stenosis by prior echocardiography 10/2016   Progression from mild to moderate (2015 - 2017 Echo)   Past Surgical History:  Procedure Laterality Date   BILATERAL LOWER ATERIAL DOPPLER  10/23/2011   BILATERAL ABIs normal 1.2;   left lower venous  doppler  10/23/2011; 09/24/2014   a. L CFV - visble thrombus noted wthin the vessel without complete compressibilty,the complete common femoral vein demonstrate excellent flow throughout consistent with chronic disease; left popliteal vein -visble thrombus;demonstrares excellent flow troughout consistent with chronic disease;; b. 08/2014: L Lower Extr - no acute or chronic thrombosis or thrombophlebitis. Deep venous insufficiency   Lower Extremity Venous Dopplers  05/2010   Chronic left popliteal venous thrombosis and acute small saphenous vein thrombosis in the left.   TRANSTHORACIC ECHOCARDIOGRAM  10/2016   Normal LV size and function (likely vigorous). EF 65-70% with normal wall motion. GR1 DD.   TRANSTHORACIC ECHOCARDIOGRAM  07/05/2008   EF =>55%; MILD MITRAL REGURG.  Mild aortic valve calcification    Allergies  Allergen Reactions   Ace Inhibitors Cough    Allergies as of 10/09/2024       Reactions   Ace Inhibitors Cough        Medication List        Accurate as of October 09, 2024  3:14 PM. If you have any questions, ask your nurse or doctor.          aspirin 81 MG chewable tablet Chew 81 mg by mouth daily.   atorvastatin  20 MG tablet Commonly known as: LIPITOR Take 1 tablet (20 mg total) by mouth daily. <PLEASE MAKE APPOINTMENT FOR REFILLS>   furosemide 20 MG tablet Commonly known as: LASIX Take 1 tablet (20 mg total) by mouth daily. Started by: Jovaun Levene X Ariannie Penaloza   GLUCOSAMINE PO Take 2 tablets by mouth daily.   hydrochlorothiazide  12.5 MG tablet Commonly known as: HYDRODIURIL Take 12.5 mg by mouth daily.   losartan 50 MG tablet Commonly known as: COZAAR Take 50 mg by mouth daily.   MiraLax 17 GM/SCOOP powder Generic drug: polyethylene glycol powder Take 17 g by mouth as needed for moderate constipation.   multivitamin tablet Take 1 tablet by mouth daily. Centrum Silver   potassium chloride SA 20 MEQ tablet Commonly known as: KLOR-CON M Take 1 tablet (20 mEq total) by mouth daily. Started by: Princes [place]inger X Adjoa Althouse        Review of Systems  Constitutional:  Negative for appetite change, fatigue and fever.  HENT:  Negative for congestion and trouble swallowing.   Eyes:  Negative for visual disturbance.  Respiratory:  Negative for cough and shortness of breath.   Cardiovascular:  Positive for leg swelling.  Gastrointestinal:  Negative for abdominal pain and constipation.  Genitourinary:  Negative for dysuria and urgency.  Musculoskeletal:  Positive for gait problem.  Skin:  Negative for color change.  Neurological:  Negative for speech difficulty and headaches.       Memory deficits.   Psychiatric/Behavioral:  Positive for confusion. Negative for sleep disturbance.     Immunization History  Administered Date(s) Administered   INFLUENZA, HIGH DOSE SEASONAL PF 10/02/2023   Moderna Covid-19 Vaccine Bivalent Booster 103yrs & up 10/16/2023   PFIZER(Purple Top)SARS-COV-2 Vaccination 02/13/2020, 03/07/2020   PNEUMOCOCCAL CONJUGATE-20 06/10/2023   Tdap 06/10/2023   Zoster Recombinant(Shingrix) 11/29/2023, 01/27/2024   Pertinent  Health Maintenance Due  Topic Date Due   Influenza Vaccine  07/31/2024       No data to display         Functional Status Survey:    Vitals:   10/09/24 1416  BP: 106/70  Pulse: 76  Resp: 18  Temp: (!) 97.5 F (36.4 C)  SpO2: 97%  Weight: 197 lb 6.4 oz (89.5 kg)   Body mass index is 30.01 kg/m. Physical Exam Vitals and nursing note reviewed.  Constitutional:       Appearance: Normal appearance.  HENT:     Head: Normocephalic and atraumatic.     Nose: Nose normal.     Mouth/Throat:     Mouth: Mucous membranes are moist.  Eyes:     Extraocular Movements: Extraocular movements intact.     Conjunctiva/sclera: Conjunctivae normal.     Pupils: Pupils are equal, round, and reactive to light.  Cardiovascular:     Rate and Rhythm: Normal rate.     Heart sounds: Murmur heard.  Pulmonary:     Effort: Pulmonary effort is normal.  Breath sounds: No rales.  Abdominal:     General: Bowel sounds are normal.     Palpations: Abdomen is soft.     Tenderness: There is no abdominal tenderness.  Musculoskeletal:     Cervical back: Normal range of motion and neck supple.     Right lower leg: Edema present.     Left lower leg: Edema present.     Comments: Trace-1+ edema BLE  Skin:    General: Skin is warm and dry.  Neurological:     General: No focal deficit present.     Mental Status: He is alert. Mental status is at baseline.     Gait: Gait abnormal.     Comments: Oriented to person, his room  Psychiatric:        Mood and Affect: Mood normal.        Behavior: Behavior normal.     Labs reviewed: Recent Labs    10/22/23 0000 05/07/24 0000  NA 142 142  K 3.8 4.2  CL 102 103  CO2 30* 33*  BUN 32* 23*  CREATININE 1.0 0.9  CALCIUM  9.9 9.5   Recent Labs    10/22/23 0000  AST 21  ALT 27  ALKPHOS 64  ALBUMIN 4.5   Recent Labs    10/22/23 0000 11/05/23 0000  WBC 8.6 6.9  NEUTROABS  --  5,078.00  HGB 17.3 16.1  HCT 52 48  PLT 186 156    No results found for: HGBA1C Lab Results  Component Value Date   CHOL 144 10/22/2023   HDL 41 10/22/2023   LDLCALC 86 10/22/2023   TRIG 80 10/22/2023   CHOLHDL 3.5 11/02/2014    Significant Diagnostic Results in last 30 days:  No results found.  Assessment/Plan: Right leg pain eported R leg pain 10/08/24, no pain upon my examination today.   X-ray L spine, pelvis, R hip/femur/knee  ordered 10/08/24-the patient refused.    Major neurocognitive disorder (HCC) progressing, needs more supervision and assistance for ADLs, 11/04/23 MMSE 9/30, TSH 3.46 10/06/24  Hyperlipidemia with target LDL less than 70  taking Atorvastatin , ASA, LDL 74 10/06/24  Essential hypertension Blood pressure is controlled, taking Losartan/hydrochlorothiazide, Bun/creat 26/1.05 10/06/24  Elevated hemoglobin Elevated Hgb 16.1, plt 156 11/05/23, 17/190 10/06/24, evaluated by oncology.              Hx of DVT LE, evaluated and treated by Cardiovascular specialist.   Edema Edema BLE, mild, weight gained about # 10Ibs in the past 2 months, BNP 344 10/08/24, no respiratory symptoms, on low dose of Furosemide, Na 141, K 4.0 10/06/24    Family/ staff Communication: plan of care reviewed with the patient and charge nurse.   Labs/tests ordered:  pending Xray L spin, pelvis, R hip/femur/knee-patient refused.

## 2024-11-11 ENCOUNTER — Non-Acute Institutional Stay (SKILLED_NURSING_FACILITY): Payer: Self-pay | Admitting: Adult Health

## 2024-11-11 ENCOUNTER — Encounter: Payer: Self-pay | Admitting: Adult Health

## 2024-11-11 DIAGNOSIS — F03C0 Unspecified dementia, severe, without behavioral disturbance, psychotic disturbance, mood disturbance, and anxiety: Secondary | ICD-10-CM | POA: Diagnosis not present

## 2024-11-11 DIAGNOSIS — I10 Essential (primary) hypertension: Secondary | ICD-10-CM | POA: Diagnosis not present

## 2024-11-11 NOTE — Progress Notes (Signed)
 Location:  [facility] Room Number: [id number] Place of Service:  SNF (31) Provider:  Medina-Vargas, Christo Hain, DNP, FNP-BC  Patient Care Team: Mast, Man X, NP as PCP - General (Internal Medicine)  Extended Emergency Contact Information Primary Emergency Contact: Brandus,Lynn  [place] Home Phone: [phone] Mobile Phone: [phone] Relation: Daughter  Code Status:  DNR  Goals of care: Advanced Directive information    10/01/2024    9:43 AM  Advanced Directives  Does Patient Have a Medical Advance Directive? Yes  Type of Advance Directive Out of facility DNR (pink MOST or yellow form)  Does patient want to make changes to medical advance directive? No - Patient declined  Pre-existing out of facility DNR order (yellow form or pink MOST form) Pink MOST form placed in chart (order not valid for inpatient use);Yellow form placed in chart (order not valid for inpatient use)     Chief Complaint  Patient presents with   Medication Management    med management    HPI:  Pt is a 89 y.o. male seen today for medication management. She is a resident of [facility]. She is currently taking Furosemide  10 mg daily, hydrochlorothiazide 12.5 mg daily and Losartan 50 mg daily for hypertension. Latest BP 126/60. She takes KCL 10 meq daily for supplementation. Latest K+ 4.0, normal.  He gained 2 lbs in 2 months. No shortness of breath.  Past Medical History:  Diagnosis Date   Essential hypertension    History of DVT of lower extremity 2009   LEFT COMMON FEMORAL VEINS AND LEFT POSTERIOR TIBAL VEIS IN THE CALF;  stop taking  warfarin in 2010   Hyperlipidemia LDL goal <100    Memory difficulty 07/30/2017   Moderate aortic stenosis by prior echocardiography 10/2016   Progression from mild to moderate (2015 - 2017 Echo)   Past Surgical History:  Procedure Laterality Date   BILATERAL LOWER ATERIAL DOPPLER  10/23/2011   BILATERAL ABIs  normal 1.2;   left lower venous  doppler  10/23/2011; 09/24/2014   a. L CFV - visble thrombus noted wthin the vessel without complete compressibilty,the complete common femoral vein demonstrate excellent flow throughout consistent with chronic disease; left popliteal vein -visble thrombus;demonstrares excellent flow troughout consistent with chronic disease;; b. 08/2014: L Lower Extr - no acute or chronic thrombosis or thrombophlebitis. Deep venous insufficiency   Lower Extremity Venous Dopplers  05/2010   Chronic left popliteal venous thrombosis and acute small saphenous vein thrombosis in the left.   TRANSTHORACIC ECHOCARDIOGRAM  10/2016   Normal LV size and function (likely vigorous). EF 65-70% with normal wall motion. GR1 DD.   TRANSTHORACIC ECHOCARDIOGRAM  07/05/2008   EF =>55%; MILD MITRAL REGURG. Mild aortic valve calcification    Allergies  Allergen Reactions   Ace Inhibitors Cough    Outpatient Encounter Medications as of 11/11/2024  Medication Sig   aspirin 81 MG chewable tablet Chew 81 mg by mouth daily.   atorvastatin  (LIPITOR) 20 MG tablet Take 1 tablet (20 mg total) by mouth daily. <PLEASE MAKE APPOINTMENT FOR REFILLS>   furosemide  (LASIX ) 20 MG tablet Take 1 tablet (20 mg total) by mouth daily.   Glucosamine HCl (GLUCOSAMINE PO) Take 2 tablets by mouth daily.   hydrochlorothiazide (HYDRODIURIL) 12.5 MG tablet Take 12.5 mg by mouth daily.   losartan (COZAAR) 50 MG tablet Take 50 mg by mouth daily.   Multiple Vitamin (MULTIVITAMIN) tablet Take 1 tablet by mouth daily. Centrum Silver  polyethylene glycol powder (MIRALAX) 17 GM/SCOOP powder Take 17 g by mouth as needed for moderate constipation.   potassium chloride  SA (KLOR-CON  M) 20 MEQ tablet Take 1 tablet (20 mEq total) by mouth daily.   No facility-administered encounter medications on file as of 11/11/2024.    Review of Systems   Unable to obtain due to dementia   Immunization History  Administered Date(s)  Administered   INFLUENZA, HIGH DOSE SEASONAL PF 10/02/2023, 10/06/2024   Moderna Covid-19 Vaccine Bivalent Booster 30yrs & up 10/16/2023   PFIZER(Purple Top)SARS-COV-2 Vaccination 02/13/2020, 03/07/2020   PNEUMOCOCCAL CONJUGATE-20 06/10/2023   Tdap 06/10/2023   Unspecified SARS-COV-2 Vaccination 10/26/2024   Zoster Recombinant(Shingrix) 11/29/2023, 01/27/2024   Pertinent  Health Maintenance Due  Topic Date Due   Influenza Vaccine  Completed       No data to display           Vitals:   11/11/24 1127  BP: 126/60  Pulse: 77  Resp: 18  Temp: 98.3 F (36.8 C)  SpO2: 97%  Weight: 197 lb 11.2 oz (89.7 kg)  Height: 5' 8 (1.727 m)   Body mass index is 30.06 kg/m.  Physical Exam Constitutional:      Appearance: Normal appearance.  HENT:     Head: Normocephalic and atraumatic.     Mouth/Throat:     Mouth: Mucous membranes are moist.  Eyes:     Conjunctiva/sclera: Conjunctivae normal.  Cardiovascular:     Rate and Rhythm: Normal rate and regular rhythm.     Pulses: Normal pulses.     Heart sounds: Normal heart sounds.  Pulmonary:     Effort: Pulmonary effort is normal.     Breath sounds: Normal breath sounds.  Abdominal:     General: Bowel sounds are normal.     Palpations: Abdomen is soft.  Musculoskeletal:        General: No swelling.     Cervical back: Normal range of motion.  Skin:    General: Skin is warm and dry.  Neurological:     Mental Status: He is alert.  Psychiatric:        Mood and Affect: Mood normal.        Behavior: Behavior normal.      Labs reviewed: Recent Labs    05/07/24 0000 10/06/24 0835  NA 142 141  K 4.2 4.0  CL 103 103  CO2 33* 26*  BUN 23* 26*  CREATININE 0.9 1.1  CALCIUM  9.5 9.4   Recent Labs    10/06/24 0835  AST 22  ALT 30  ALKPHOS 66  ALBUMIN 4.2   Recent Labs    10/06/24 0835  WBC 8.7  NEUTROABS 6,769.00  HGB 17.0  HCT 50  PLT 190   Lab Results  Component Value Date   TSH 3.46 10/06/2024   No  results found for: HGBA1C Lab Results  Component Value Date   CHOL 143 10/06/2024   HDL 49 10/06/2024   LDLCALC 74 10/06/2024   TRIG 122 10/06/2024   CHOLHDL 3.5 11/02/2014    Significant Diagnostic Results in last 30 days:  No results found.  Assessment/Plan  1. Essential hypertension (Primary) -  BP is stable -  no shortness of breath -  continue Furosemide  10 mg daily, hydrochlorothiazide 12.5 mg daily and Losartan 50 mg daily  2. Severe dementia without behavioral disturbance, psychotic disturbance, mood disturbance, or anxiety, unspecified dementia type (HCC) -  BIMS score 3/15, ranging as severe cognitive impairment -  continue supportive care  -  fall precautions   Family/ staff Communication: Discussed plan of care with resident and charge nurse.  Labs/tests ordered:  BNP and BMP    Jereld Serum, DNP, MSN, FNP-BC [facility] [phone] (Monday-Friday 8:00 a.m. - 5:00 p.m.) [phone] (after hours)

## 2024-11-12 LAB — BASIC METABOLIC PANEL WITH GFR
BUN: 21 (ref 4–21)
Chloride: 100 (ref 99–108)
Creatinine: 1 (ref 0.6–1.3)
Potassium: 3.8 meq/L (ref 3.5–5.1)
Sodium: 142 (ref 137–147)

## 2024-11-12 LAB — COMPREHENSIVE METABOLIC PANEL WITH GFR
Calcium: 9.2 (ref 8.7–10.7)
eGFR: 72

## 2024-11-16 ENCOUNTER — Non-Acute Institutional Stay (SKILLED_NURSING_FACILITY): Payer: Self-pay | Admitting: Nurse Practitioner

## 2024-11-16 ENCOUNTER — Encounter: Payer: Self-pay | Admitting: Nurse Practitioner

## 2024-11-16 DIAGNOSIS — I10 Essential (primary) hypertension: Secondary | ICD-10-CM

## 2024-11-16 DIAGNOSIS — R609 Edema, unspecified: Secondary | ICD-10-CM

## 2024-11-16 DIAGNOSIS — F0390 Unspecified dementia without behavioral disturbance: Secondary | ICD-10-CM

## 2024-11-16 NOTE — Assessment & Plan Note (Signed)
 progressing, needs more supervision and assistance for ADLs, 11/04/23 MMSE 9/30, TSH 3.46 10/06/24

## 2024-11-16 NOTE — Progress Notes (Unsigned)
 Location:   [facility] Room Number: [id number] Place of Service:  SNF (31) Provider: Larwance Shakerria Parran NP  Kingstin Heims X, NP  Patient Care Team: Aubra Pappalardo X, NP as PCP - General (Internal Medicine)  Extended Emergency Contact Information Primary Emergency Contact: Brandus,Lynn  [place] Home Phone: [phone] Mobile Phone: [phone] Relation: Daughter  Code Status:  DNR Goals of care: Advanced Directive information    11/11/2024   11:49 AM  Advanced Directives  Does Patient Have a Medical Advance Directive? Yes  Type of Advance Directive Out of facility DNR (pink MOST or yellow form)  Does patient want to make changes to medical advance directive? No - Patient declined  Pre-existing out of facility DNR order (yellow form or pink MOST form) Pink MOST form placed in chart (order not valid for inpatient use);Yellow form placed in chart (order not valid for inpatient use)     Chief Complaint  Patient presents with  . Medical Management of Chronic Issues    HPI:  Pt is a 89 y.o. male seen today for medical management of chronic diseases.    Dementia, progressing, needs more supervision and assistance for ADLs, 11/04/23 MMSE 9/30, TSH 3.46 10/06/24             HLD, taking Atorvastatin , ASA, LDL 74 10/06/24             HTN, taking Losartan/hydrochlorothiazide, Bun/creat 21/1.01 11/12/24             Elevated Hgb 16.1, plt 156 11/05/23, 17/190 10/06/24, evaluated by oncology.              Hx of DVT LE, evaluated and treated by Cardiovascular specialist.              Constipation, prn MiraLax             Edema BLE, mild, weight stable in the past month, on Furosemide 10mg , hydrochlorothiazide 12.5mg , BNP 203 11/12/24<<344 10/08/24, pending BNP one week from 11/12/24      Past Medical History:  Diagnosis Date  . Essential hypertension   . History of DVT of lower extremity 2009   LEFT COMMON FEMORAL VEINS AND LEFT POSTERIOR TIBAL VEIS IN THE CALF;  stop taking   warfarin in 2010  . Hyperlipidemia LDL goal <100   . Memory difficulty 07/30/2017  . Moderate aortic stenosis by prior echocardiography 10/2016   Progression from mild to moderate (2015 - 2017 Echo)   Past Surgical History:  Procedure Laterality Date  . BILATERAL LOWER ATERIAL DOPPLER  10/23/2011   BILATERAL ABIs normal 1.2;  . left lower venous  doppler  10/23/2011; 09/24/2014   a. L CFV - visble thrombus noted wthin the vessel without complete compressibilty,the complete common femoral vein demonstrate excellent flow throughout consistent with chronic disease; left popliteal vein -visble thrombus;demonstrares excellent flow troughout consistent with chronic disease;; b. 08/2014: L Lower Extr - no acute or chronic thrombosis or thrombophlebitis. Deep venous insufficiency  . Lower Extremity Venous Dopplers  05/2010   Chronic left popliteal venous thrombosis and acute small saphenous vein thrombosis in the left.  . TRANSTHORACIC ECHOCARDIOGRAM  10/2016   Normal LV size and function (likely vigorous). EF 65-70% with normal wall motion. GR1 DD.  SABRA TRANSTHORACIC ECHOCARDIOGRAM  07/05/2008   EF =>55%; MILD MITRAL REGURG. Mild aortic valve calcification    Allergies  Allergen Reactions  . Ace Inhibitors Cough    Allergies as of 11/16/2024  Reactions   Ace Inhibitors Cough        Medication List        Accurate as of November 16, 2024 11:59 PM. If you have any questions, ask your nurse or doctor.          aspirin 81 MG chewable tablet Chew 81 mg by mouth daily.   atorvastatin  20 MG tablet Commonly known as: LIPITOR Take 1 tablet (20 mg total) by mouth daily. <PLEASE MAKE APPOINTMENT FOR REFILLS>   furosemide 20 MG tablet Commonly known as: LASIX Take 1 tablet (20 mg total) by mouth daily.   GLUCOSAMINE PO Take 2 tablets by mouth daily.   hydrochlorothiazide 12.5 MG tablet Commonly known as: HYDRODIURIL Take 12.5 mg by mouth daily.   losartan 50 MG  tablet Commonly known as: COZAAR Take 50 mg by mouth daily.   MiraLax 17 GM/SCOOP powder Generic drug: polyethylene glycol powder Take 17 g by mouth as needed for moderate constipation.   multivitamin tablet Take 1 tablet by mouth daily. Centrum Silver   potassium chloride SA 20 MEQ tablet Commonly known as: KLOR-CON M Take 1 tablet (20 mEq total) by mouth daily.        Review of Systems  Constitutional:  Negative for appetite change, fatigue and fever.  HENT:  Negative for congestion and trouble swallowing.   Eyes:  Negative for visual disturbance.  Respiratory:  Negative for cough and shortness of breath.   Cardiovascular:  Positive for leg swelling.  Gastrointestinal:  Negative for abdominal pain and constipation.  Genitourinary:  Negative for dysuria and urgency.  Musculoskeletal:  Positive for gait problem.  Skin:  Negative for color change.  Neurological:  Negative for speech difficulty and headaches.       Memory deficits.   Psychiatric/Behavioral:  Positive for confusion. Negative for sleep disturbance.     Immunization History  Administered Date(s) Administered  . INFLUENZA, HIGH DOSE SEASONAL PF 10/02/2023, 10/06/2024  . Moderna Covid-19 Vaccine Bivalent Booster 79yrs & up 10/16/2023  . PFIZER(Purple Top)SARS-COV-2 Vaccination 02/13/2020, 03/07/2020  . PNEUMOCOCCAL CONJUGATE-20 06/10/2023  . Tdap 06/10/2023  . Unspecified SARS-COV-2 Vaccination 10/26/2024  . Zoster Recombinant(Shingrix) 11/29/2023, 01/27/2024   Pertinent  Health Maintenance Due  Topic Date Due  . Influenza Vaccine  Completed       No data to display         Functional Status Survey:    Vitals:   11/16/24 1411  BP: 124/66  Pulse: 77  Resp: 18  Temp: (!) 97.5 F (36.4 C)  SpO2: 97%  Weight: 197 lb 11.2 oz (89.7 kg)   Body mass index is 30.06 kg/m. Physical Exam Vitals and nursing note reviewed.  Constitutional:      Appearance: Normal appearance.  HENT:     Head:  Normocephalic and atraumatic.     Nose: Nose normal.     Mouth/Throat:     Mouth: Mucous membranes are moist.  Eyes:     Extraocular Movements: Extraocular movements intact.     Conjunctiva/sclera: Conjunctivae normal.     Pupils: Pupils are equal, round, and reactive to light.  Cardiovascular:     Rate and Rhythm: Normal rate.     Heart sounds: Murmur heard.  Pulmonary:     Effort: Pulmonary effort is normal.     Breath sounds: No rales.  Abdominal:     General: Bowel sounds are normal.     Palpations: Abdomen is soft.     Tenderness: There is no abdominal  tenderness.  Musculoskeletal:     Cervical back: Normal range of motion and neck supple.     Right lower leg: Edema present.     Left lower leg: Edema present.     Comments: Trace-1+ edema BLE  Skin:    General: Skin is warm and dry.  Neurological:     General: No focal deficit present.     Mental Status: He is alert. Mental status is at baseline.     Gait: Gait abnormal.     Comments: Oriented to person, his room  Psychiatric:        Mood and Affect: Mood normal.        Behavior: Behavior normal.     Labs reviewed: Recent Labs    05/07/24 0000 10/06/24 0835  NA 142 141  K 4.2 4.0  CL 103 103  CO2 33* 26*  BUN 23* 26*  CREATININE 0.9 1.1  CALCIUM  9.5 9.4   Recent Labs    10/06/24 0835  AST 22  ALT 30  ALKPHOS 66  ALBUMIN 4.2   Recent Labs    10/06/24 0835  WBC 8.7  NEUTROABS 6,769.00  HGB 17.0  HCT 50  PLT 190   Lab Results  Component Value Date   TSH 3.46 10/06/2024   No results found for: HGBA1C Lab Results  Component Value Date   CHOL 143 10/06/2024   HDL 49 10/06/2024   LDLCALC 74 10/06/2024   TRIG 122 10/06/2024   CHOLHDL 3.5 11/02/2014    Significant Diagnostic Results in last 30 days:  No results found.  Assessment/Plan  Essential hypertension Blood pressure is controlled,  taking Losartan/hydrochlorothiazide, Bun/creat 21/1.01 11/12/24  Edema weight stable in the  past month, on Furosemide 10mg , hydrochlorothiazide 12.5mg , BNP 203 11/12/24<<344 10/08/24, pending BNP one week from 11/12/24 Monitor for signs symptoms of developing CHF  Major neurocognitive disorder (HCC)  progressing, needs more supervision and assistance for ADLs, 11/04/23 MMSE 9/30, TSH 3.46 10/06/24   Family/ staff Communication: Plan of care reviewed with the patient and the charge nurse  Labs/tests ordered: Pending BMP 1 week

## 2024-11-16 NOTE — Assessment & Plan Note (Signed)
 weight stable in the past month, on Furosemide 10mg , hydrochlorothiazide 12.5mg , BNP 203 11/12/24<<344 10/08/24, pending BNP one week from 11/12/24 Monitor for signs symptoms of developing CHF

## 2024-11-16 NOTE — Assessment & Plan Note (Signed)
 Blood pressure is controlled,  taking Losartan/hydrochlorothiazide, Bun/creat 21/1.01 11/12/24

## 2025-01-01 ENCOUNTER — Non-Acute Institutional Stay: Payer: Self-pay | Admitting: Family Medicine

## 2025-01-01 DIAGNOSIS — I10 Essential (primary) hypertension: Secondary | ICD-10-CM

## 2025-01-01 DIAGNOSIS — E785 Hyperlipidemia, unspecified: Secondary | ICD-10-CM | POA: Diagnosis not present

## 2025-01-01 DIAGNOSIS — F0390 Unspecified dementia without behavioral disturbance: Secondary | ICD-10-CM | POA: Diagnosis not present

## 2025-01-01 NOTE — Assessment & Plan Note (Signed)
 No change; no meds; appropriate for current placement

## 2025-01-01 NOTE — Progress Notes (Signed)
 " Provider:  Garnette Pinal, MD Location:      Place of Service:     PCP: Mast, Man X, NP Patient Care Team: Mast, Man X, NP as PCP - General (Internal Medicine)  Extended Emergency Contact Information Primary Emergency Contact: Brandus,Lynn  [place] Home Phone: [phone] Mobile Phone: [phone] Relation: Daughter  Code Status:  Goals of Care: Advanced Directive information    11/11/2024   11:49 AM  Advanced Directives  Does Patient Have a Medical Advance Directive? Yes  Type of Advance Directive Out of facility DNR (pink MOST or yellow form)  Does patient want to make changes to medical advance directive? No - Patient declined  Pre-existing out of facility DNR order (yellow form or pink MOST form) Pink MOST form placed in chart (order not valid for inpatient use);Yellow form placed in chart (order not valid for inpatient use)     HPI: Patient is a 89 y.o. male seen today for medical management of chronic problems including: Major neurocognitive disorder, hypertension, edema, and hyperlipidemia.  Patient currently resides in memory care unit.  Last MMSE was 9/30 last year.  I encountered him today in the living room, area in memory care.  He was able to answer simple questions but declined exam.  He has no complaints and nurses report no new problems.  He continues to have edema bilaterally.  Has a history of chronic blood clots in lower extremity.  Blood pressure is well-controlled with combination losartan and hydrochlorothiazide.  He also takes furosemide  for the chronic edema.  He is requiring more supervision and assistance by history.  Past Medical History:  Diagnosis Date   Essential hypertension    History of DVT of lower extremity 2009   LEFT COMMON FEMORAL VEINS AND LEFT POSTERIOR TIBAL VEIS IN THE CALF;  stop taking  warfarin in 2010   Hyperlipidemia LDL goal <100    Memory difficulty 07/30/2017   Moderate aortic stenosis by prior  echocardiography 10/2016   Progression from mild to moderate (2015 - 2017 Echo)   Past Surgical History:  Procedure Laterality Date   BILATERAL LOWER ATERIAL DOPPLER  10/23/2011   BILATERAL ABIs normal 1.2;   left lower venous  doppler  10/23/2011; 09/24/2014   a. L CFV - visble thrombus noted wthin the vessel without complete compressibilty,the complete common femoral vein demonstrate excellent flow throughout consistent with chronic disease; left popliteal vein -visble thrombus;demonstrares excellent flow troughout consistent with chronic disease;; b. 08/2014: L Lower Extr - no acute or chronic thrombosis or thrombophlebitis. Deep venous insufficiency   Lower Extremity Venous Dopplers  05/2010   Chronic left popliteal venous thrombosis and acute small saphenous vein thrombosis in the left.   TRANSTHORACIC ECHOCARDIOGRAM  10/2016   Normal LV size and function (likely vigorous). EF 65-70% with normal wall motion. GR1 DD.   TRANSTHORACIC ECHOCARDIOGRAM  07/05/2008   EF =>55%; MILD MITRAL REGURG. Mild aortic valve calcification    reports that he has quit smoking. He has never used smokeless tobacco. He reports current alcohol use. He reports that he does not use drugs. Social History   Socioeconomic History   Marital status: Married    Spouse name: Not on file   Number of children: 2   Years of education: College   Highest education level: Not on file  Occupational History   Occupation: Retired  Tobacco Use   Smoking status: Former   Smokeless tobacco: Never  Substance and Sexual Activity   Alcohol use:  Yes    Comment: 2-3 glasses    Drug use: No   Sexual activity: Not on file  Other Topics Concern   Not on file  Social History Narrative   SH: Live alone   - 2 children, 3 GC.     Usually exercises ~3 x week with long walks.    Quit smoking in his 2s.     2 glasses of wine/day   Caffeine use: 1 cup coffee every morning   Right handed   Social Drivers of Health   Tobacco  Use: Medium Risk (11/16/2024)   Patient History    Smoking Tobacco Use: Former    Smokeless Tobacco Use: Never    Passive Exposure: Not on Actuary Strain: Not on file  Food Insecurity: Not on file  Transportation Needs: Not on file  Physical Activity: Not on file  Stress: Not on file  Social Connections: Not on file  Intimate Partner Violence: Not on file  Depression (PHQ2-9): Low Risk (11/26/2023)   Depression (PHQ2-9)    PHQ-2 Score: 0  Alcohol Screen: Not on file  Housing: Not on file  Utilities: Not on file  Health Literacy: Not on file    Functional Status Survey:    No family history on file.  Health Maintenance  Topic Date Due   Medicare Annual Wellness (AWV)  11/25/2024   COVID-19 Vaccine (5 - 2025-26 season) 04/26/2025   DTaP/Tdap/Td (2 - Td or Tdap) 06/09/2033   Pneumococcal Vaccine: 50+ Years  Completed   Influenza Vaccine  Completed   Zoster Vaccines- Shingrix  Completed   Meningococcal B Vaccine  Aged Out    Allergies[1]  Outpatient Encounter Medications as of 01/01/2025  Medication Sig   aspirin 81 MG chewable tablet Chew 81 mg by mouth daily.   atorvastatin  (LIPITOR) 20 MG tablet Take 1 tablet (20 mg total) by mouth daily. <PLEASE MAKE APPOINTMENT FOR REFILLS>   furosemide  (LASIX ) 20 MG tablet Take 1 tablet (20 mg total) by mouth daily.   Glucosamine HCl (GLUCOSAMINE PO) Take 2 tablets by mouth daily.   hydrochlorothiazide (HYDRODIURIL) 12.5 MG tablet Take 12.5 mg by mouth daily.   losartan (COZAAR) 50 MG tablet Take 50 mg by mouth daily.   Multiple Vitamin (MULTIVITAMIN) tablet Take 1 tablet by mouth daily. Centrum Silver   polyethylene glycol powder (MIRALAX) 17 GM/SCOOP powder Take 17 g by mouth as needed for moderate constipation.   potassium chloride  SA (KLOR-CON  M) 20 MEQ tablet Take 1 tablet (20 mEq total) by mouth daily.   No facility-administered encounter medications on file as of 01/01/2025.    Review of Systems  Unable to  perform ROS: Dementia    There were no vitals filed for this visit. There is no height or weight on file to calculate BMI. Physical Exam Vitals and nursing note reviewed.  Constitutional:      Appearance: Normal appearance.  Cardiovascular:     Rate and Rhythm: Normal rate and regular rhythm.  Pulmonary:     Effort: Pulmonary effort is normal.     Breath sounds: Normal breath sounds.  Neurological:     Mental Status: He is alert.     Comments: Oriented to person  Psychiatric:        Mood and Affect: Mood normal.        Behavior: Behavior normal.     Labs reviewed: Basic Metabolic Panel: Recent Labs    05/07/24 0000 10/06/24 0835  NA 142 141  K 4.2 4.0  CL 103 103  CO2 33* 26*  BUN 23* 26*  CREATININE 0.9 1.1  CALCIUM  9.5 9.4   Liver Function Tests: Recent Labs    10/06/24 0835  AST 22  ALT 30  ALKPHOS 66  ALBUMIN 4.2   No results for input(s): LIPASE, AMYLASE in the last 8760 hours. No results for input(s): AMMONIA in the last 8760 hours. CBC: Recent Labs    10/06/24 0835  WBC 8.7  NEUTROABS 6,769.00  HGB 17.0  HCT 50  PLT 190   Cardiac Enzymes: No results for input(s): CKTOTAL, CKMB, CKMBINDEX, TROPONINI in the last 8760 hours. BNP: Invalid input(s): POCBNP No results found for: HGBA1C Lab Results  Component Value Date   TSH 3.46 10/06/2024   No results found for: VITAMINB12 No results found for: FOLATE No results found for: IRON, TIBC, FERRITIN  Imaging and Procedures obtained prior to SNF admission: CT Head Wo Contrast Result Date: 07/26/2018 CLINICAL DATA:  Fall from stool while drinking at a bar striking head. Altered mental status. Altered level of consciousness (LOC), unexplained; Neck pain, initial exam EXAM: CT HEAD WITHOUT CONTRAST CT CERVICAL SPINE WITHOUT CONTRAST TECHNIQUE: Multidetector CT imaging of the head and cervical spine was performed following the standard protocol without intravenous contrast.  Multiplanar CT image reconstructions of the cervical spine were also generated. COMPARISON:  Head CT 06/07/2017 FINDINGS: CT HEAD FINDINGS Brain: Mild atrophy and chronic small vessel ischemia, stable and unchanged from prior exam. No intracranial hemorrhage, mass effect, or midline shift. No hydrocephalus. The basilar cisterns are patent. No evidence of territorial infarct or acute ischemia. No extra-axial or intracranial fluid collection. Vascular: No hyperdense vessel or unexpected calcification. Skull: No fracture or focal lesion. Sinuses/Orbits: Paranasal sinuses and mastoid air cells are clear. The visualized orbits are unremarkable. Other: None. CT CERVICAL SPINE FINDINGS Alignment: No traumatic subluxation. Straightening of normal lordosis. Minimal anterolisthesis of C4 on C5 and C5 on C6 is likely degenerative. Skull base and vertebrae: No acute fracture. Vertebral body heights are maintained. The dens and skull base are intact. Soft tissues and spinal canal: No prevertebral fluid or swelling. No visible canal hematoma. Disc levels: Diffuse disc space narrowing and endplate spurring throughout. Posterior disc osteophyte complex at C5-C6 and C6-C7 causing mild mass-effect on the spinal canal. Moderate multilevel facet arthropathy. Upper chest: Carotid calcifications with medial course of the right carotid artery. Calcified granuloma at the right lung apex. Other: None. IMPRESSION: 1.  No acute intracranial abnormality.  No skull fracture. 2. Multilevel degenerative change throughout the cervical spine without acute fracture or traumatic subluxation. Electronically Signed   By: Andrea Bernhardt M.D.   On: 07/26/2018 21:56   CT Cervical Spine Wo Contrast Result Date: 07/26/2018 CLINICAL DATA:  Fall from stool while drinking at a bar striking head. Altered mental status. Altered level of consciousness (LOC), unexplained; Neck pain, initial exam EXAM: CT HEAD WITHOUT CONTRAST CT CERVICAL SPINE WITHOUT  CONTRAST TECHNIQUE: Multidetector CT imaging of the head and cervical spine was performed following the standard protocol without intravenous contrast. Multiplanar CT image reconstructions of the cervical spine were also generated. COMPARISON:  Head CT 06/07/2017 FINDINGS: CT HEAD FINDINGS Brain: Mild atrophy and chronic small vessel ischemia, stable and unchanged from prior exam. No intracranial hemorrhage, mass effect, or midline shift. No hydrocephalus. The basilar cisterns are patent. No evidence of territorial infarct or acute ischemia. No extra-axial or intracranial fluid collection. Vascular: No hyperdense vessel or unexpected calcification. Skull: No fracture or  focal lesion. Sinuses/Orbits: Paranasal sinuses and mastoid air cells are clear. The visualized orbits are unremarkable. Other: None. CT CERVICAL SPINE FINDINGS Alignment: No traumatic subluxation. Straightening of normal lordosis. Minimal anterolisthesis of C4 on C5 and C5 on C6 is likely degenerative. Skull base and vertebrae: No acute fracture. Vertebral body heights are maintained. The dens and skull base are intact. Soft tissues and spinal canal: No prevertebral fluid or swelling. No visible canal hematoma. Disc levels: Diffuse disc space narrowing and endplate spurring throughout. Posterior disc osteophyte complex at C5-C6 and C6-C7 causing mild mass-effect on the spinal canal. Moderate multilevel facet arthropathy. Upper chest: Carotid calcifications with medial course of the right carotid artery. Calcified granuloma at the right lung apex. Other: None. IMPRESSION: 1.  No acute intracranial abnormality.  No skull fracture. 2. Multilevel degenerative change throughout the cervical spine without acute fracture or traumatic subluxation. Electronically Signed   By: Andrea Bernhardt M.D.   On: 07/26/2018 21:56   DG Chest Port 1 View Result Date: 07/26/2018 CLINICAL DATA:  Possible syncope. Fall from stool in a bar striking head. EXAM: PORTABLE  CHEST 1 VIEW COMPARISON:  Radiographs 09/24/2008 FINDINGS: Low lung volumes with bibasilar atelectasis. Mild cardiomegaly. Atherosclerosis of the aorta. Bronchovascular crowding without pulmonary edema. No pneumothorax or large pleural effusion. No acute osseous abnormalities are seen. IMPRESSION: Hypoventilatory chest with bibasilar atelectasis. Cardiomegaly likely accentuated by technique. Electronically Signed   By: Andrea Bernhardt M.D.   On: 07/26/2018 21:37    Assessment/Plan Assessment & Plan Essential hypertension BP today 129/78; continue losartan/hctz  Family/ staff Communication:   Labs/tests ordered:  .[email or web address]     [1]  Allergies Allergen Reactions   Ace Inhibitors Cough   "

## 2025-01-01 NOTE — Assessment & Plan Note (Addendum)
 BP today 129/78; continue losartan/hctz

## 2025-01-01 NOTE — Assessment & Plan Note (Signed)
 Continues with stati and LDL at goal

## 2025-01-05 ENCOUNTER — Non-Acute Institutional Stay (SKILLED_NURSING_FACILITY): Payer: Self-pay | Admitting: Nurse Practitioner

## 2025-01-05 ENCOUNTER — Encounter: Payer: Self-pay | Admitting: Nurse Practitioner

## 2025-01-05 DIAGNOSIS — I10 Essential (primary) hypertension: Secondary | ICD-10-CM

## 2025-01-05 DIAGNOSIS — Z66 Do not resuscitate: Secondary | ICD-10-CM | POA: Diagnosis not present

## 2025-01-05 DIAGNOSIS — D582 Other hemoglobinopathies: Secondary | ICD-10-CM

## 2025-01-05 DIAGNOSIS — F0390 Unspecified dementia without behavioral disturbance: Secondary | ICD-10-CM

## 2025-01-05 DIAGNOSIS — K5901 Slow transit constipation: Secondary | ICD-10-CM | POA: Diagnosis not present

## 2025-01-05 DIAGNOSIS — E785 Hyperlipidemia, unspecified: Secondary | ICD-10-CM

## 2025-01-05 DIAGNOSIS — R609 Edema, unspecified: Secondary | ICD-10-CM | POA: Diagnosis not present

## 2025-01-05 NOTE — Progress Notes (Signed)
 " Location:  [facility] Room Number: N106-A Place of Service:  SNF (31) Provider:  Falisa Lamora X, NP  Patient Care Team: Rosalena Mccorry X, NP as PCP - General (Internal Medicine)  Extended Emergency Contact Information Primary Emergency Contact: Brandus,Lynn  [place] Home Phone: [phone] Mobile Phone: [phone] Relation: Daughter  Code Status:  DNR Goals of care: Advanced Directive information    11/11/2024   11:49 AM  Advanced Directives  Does Patient Have a Medical Advance Directive? Yes  Type of Advance Directive Out of facility DNR (pink MOST or yellow form)  Does patient want to make changes to medical advance directive? No - Patient declined  Pre-existing out of facility DNR order (yellow form or pink MOST form) Pink MOST form placed in chart (order not valid for inpatient use);Yellow form placed in chart (order not valid for inpatient use)     Chief Complaint  Patient presents with   medication management of chronic issues    Patient presents for routine visit. Annual wellness due.     HPI:  Pt is a 89 y.o. male seen today for medical management of chronic diseases.     Dementia, progressing, needs more supervision and assistance for ADLs, 11/04/23 MMSE 9/30, TSH 3.46 10/06/24             HLD, taking Atorvastatin , ASA, LDL 74 10/06/24             HTN, taking Losartan/hydrochlorothiazide, Bun/creat 21/1.01 11/12/24             Elevated Hgb 16.1, plt 156 11/05/23, 17/190 10/06/24, evaluated by oncology.              Hx of DVT LE, evaluated and treated by Cardiovascular specialist.              Constipation, prn MiraLax             Edema BLE, mild, weight stable, on Furosemide  and hydrochlorothiazide, BNP 296 11/19/24>>203 11/12/24<<344 10/08/24. HPOA declined echocardiogram presently.     Past Medical History:  Diagnosis Date   Essential hypertension    History of DVT of lower extremity 2009   LEFT COMMON FEMORAL VEINS AND LEFT  POSTERIOR TIBAL VEIS IN THE CALF;  stop taking  warfarin in 2010   Hyperlipidemia LDL goal <100    Memory difficulty 07/30/2017   Moderate aortic stenosis by prior echocardiography 10/2016   Progression from mild to moderate (2015 - 2017 Echo)   Past Surgical History:  Procedure Laterality Date   BILATERAL LOWER ATERIAL DOPPLER  10/23/2011   BILATERAL ABIs normal 1.2;   left lower venous  doppler  10/23/2011; 09/24/2014   a. L CFV - visble thrombus noted wthin the vessel without complete compressibilty,the complete common femoral vein demonstrate excellent flow throughout consistent with chronic disease; left popliteal vein -visble thrombus;demonstrares excellent flow troughout consistent with chronic disease;; b. 08/2014: L Lower Extr - no acute or chronic thrombosis or thrombophlebitis. Deep venous insufficiency   Lower Extremity Venous Dopplers  05/2010   Chronic left popliteal venous thrombosis and acute small saphenous vein thrombosis in the left.   TRANSTHORACIC ECHOCARDIOGRAM  10/2016   Normal LV size and function (likely vigorous). EF 65-70% with normal wall motion. GR1 DD.   TRANSTHORACIC ECHOCARDIOGRAM  07/05/2008   EF =>55%; MILD MITRAL REGURG. Mild aortic valve calcification    Allergies[1]  Outpatient Encounter Medications as of 01/05/2025  Medication Sig   aspirin 81 MG chewable  tablet Chew 81 mg by mouth daily.   atorvastatin  (LIPITOR) 20 MG tablet Take 1 tablet (20 mg total) by mouth daily. <PLEASE MAKE APPOINTMENT FOR REFILLS> (Patient taking differently: Take 10 mg by mouth daily. <PLEASE MAKE APPOINTMENT FOR REFILLS>)   furosemide  (LASIX ) 20 MG tablet Take 10 mg by mouth daily.   Glucosamine HCl (GLUCOSAMINE PO) Take 2 tablets by mouth daily.   hydrochlorothiazide (HYDRODIURIL) 12.5 MG tablet Take 12.5 mg by mouth daily.   losartan (COZAAR) 50 MG tablet Take 50 mg by mouth daily.   Multiple Vitamin (MULTIVITAMIN) tablet Take 1 tablet by mouth daily. Centrum Silver    polyethylene glycol powder (MIRALAX) 17 GM/SCOOP powder Take 17 g by mouth as needed for moderate constipation.   potassium chloride  SA (KLOR-CON  M) 20 MEQ tablet Take 1 tablet (20 mEq total) by mouth daily. (Patient taking differently: Take 10 mEq by mouth daily.)   furosemide  (LASIX ) 20 MG tablet Take 1 tablet (20 mg total) by mouth daily. (Patient not taking: Reported on 01/05/2025)   No facility-administered encounter medications on file as of 01/05/2025.    Review of Systems  Unable to perform ROS: Dementia    Immunization History  Administered Date(s) Administered   INFLUENZA, HIGH DOSE SEASONAL PF 10/02/2023, 10/06/2024   Moderna Covid-19 Vaccine Bivalent Booster 49yrs & up 10/16/2023   PFIZER(Purple Top)SARS-COV-2 Vaccination 02/13/2020, 03/07/2020   PNEUMOCOCCAL CONJUGATE-20 06/10/2023   Tdap 06/10/2023   Unspecified SARS-COV-2 Vaccination 10/26/2024   Zoster Recombinant(Shingrix) 11/29/2023, 01/27/2024   Pertinent  Health Maintenance Due  Topic Date Due   Influenza Vaccine  Completed       No data to display         Functional Status Survey:    Vitals:   01/05/25 1512 01/05/25 1513  BP: (!) 135/90 126/74  Pulse: 89   Temp: (!) 97.5 F (36.4 C)   SpO2: 97%   Weight: 203 lb 4.8 oz (92.2 kg)   Height: 5' 8 (1.727 m)    Body mass index is 30.91 kg/m. Physical Exam Vitals and nursing note reviewed.  Constitutional:      Appearance: Normal appearance.  HENT:     Head: Normocephalic and atraumatic.     Nose: Nose normal.     Mouth/Throat:     Mouth: Mucous membranes are moist.  Eyes:     Extraocular Movements: Extraocular movements intact.     Conjunctiva/sclera: Conjunctivae normal.     Pupils: Pupils are equal, round, and reactive to light.  Cardiovascular:     Rate and Rhythm: Normal rate.     Heart sounds: Murmur heard.  Pulmonary:     Effort: Pulmonary effort is normal.     Breath sounds: No rales.  Abdominal:     General: Bowel sounds are  normal.     Palpations: Abdomen is soft.     Tenderness: There is no abdominal tenderness.  Musculoskeletal:     Cervical back: Normal range of motion and neck supple.     Right lower leg: Edema present.     Left lower leg: Edema present.     Comments: Trace-1+ edema BLE  Skin:    General: Skin is warm and dry.  Neurological:     General: No focal deficit present.     Mental Status: He is alert. Mental status is at baseline.     Gait: Gait abnormal.     Comments: Oriented to person, his room  Psychiatric:  Mood and Affect: Mood normal.        Behavior: Behavior normal.     Labs reviewed: Recent Labs    05/07/24 0000 10/06/24 0835 11/12/24 0000  NA 142 141 142  K 4.2 4.0 3.8  CL 103 103 100  CO2 33* 26*  --   BUN 23* 26* 21  CREATININE 0.9 1.1 1.0  CALCIUM  9.5 9.4 9.2   Recent Labs    10/06/24 0835  AST 22  ALT 30  ALKPHOS 66  ALBUMIN 4.2   Recent Labs    10/06/24 0835  WBC 8.7  NEUTROABS 6,769.00  HGB 17.0  HCT 50  PLT 190   Lab Results  Component Value Date   TSH 3.46 10/06/2024   No results found for: HGBA1C Lab Results  Component Value Date   CHOL 143 10/06/2024   HDL 49 10/06/2024   LDLCALC 74 10/06/2024   TRIG 122 10/06/2024   CHOLHDL 3.5 11/02/2014    Significant Diagnostic Results in last 30 days:  No results found.  Assessment/Plan Edema mild, weight stable, on Furosemide  and hydrochlorothiazide, BNP 296 11/19/24>>203 11/12/24<<344 10/08/24. HPOA declined echocardiogram presently.   Slow transit constipation Stable prn MiraLax  Elevated hemoglobin Elevated Hgb 16.1, plt 156 11/05/23, 17/190 10/06/24, evaluated by oncology.              Hx of DVT LE, evaluated and treated by Cardiovascular specialist.   Essential hypertension Blood pressure is controlled  taking Losartan/hydrochlorothiazide, Bun/creat 21/1.01 11/12/24  Hyperlipidemia with target LDL less than 70 taking Atorvastatin , ASA, LDL 74 10/06/24  Major  neurocognitive disorder (HCC) progressing, needs more supervision and assistance for ADLs, 11/04/23 MMSE 9/30, TSH 3.46 10/06/24     Family/ staff Communication: Plan of care reviewed with the patient and charge nurse  Labs/tests ordered: None         [1]  Allergies Allergen Reactions   Ace Inhibitors Cough   "

## 2025-01-07 ENCOUNTER — Encounter: Payer: Self-pay | Admitting: Nurse Practitioner

## 2025-01-07 NOTE — Assessment & Plan Note (Signed)
 taking Atorvastatin , ASA, LDL 74 10/06/24

## 2025-01-07 NOTE — Assessment & Plan Note (Signed)
 Stable prn MiraLax

## 2025-01-07 NOTE — Assessment & Plan Note (Signed)
 Blood pressure is controlled  taking Losartan/hydrochlorothiazide, Bun/creat 21/1.01 11/12/24

## 2025-01-07 NOTE — Assessment & Plan Note (Signed)
 Elevated Hgb 16.1, plt 156 11/05/23, 17/190 10/06/24, evaluated by oncology.              Hx of DVT LE, evaluated and treated by Cardiovascular specialist.

## 2025-01-07 NOTE — Assessment & Plan Note (Signed)
 progressing, needs more supervision and assistance for ADLs, 11/04/23 MMSE 9/30, TSH 3.46 10/06/24

## 2025-01-07 NOTE — Assessment & Plan Note (Signed)
 mild, weight stable, on Furosemide  and hydrochlorothiazide, BNP 296 11/19/24>>203 11/12/24<<344 10/08/24. HPOA declined echocardiogram presently.
